# Patient Record
Sex: Female | Born: 1965 | Race: Black or African American | Hispanic: No | Marital: Married | Smoking: Never smoker
Health system: Southern US, Community
[De-identification: ages and names within clinical notes are randomized; demographics above are authoritative.]

## PROBLEM LIST (undated history)

## (undated) DIAGNOSIS — M1990 Unspecified osteoarthritis, unspecified site: Secondary | ICD-10-CM

## (undated) DIAGNOSIS — J302 Other seasonal allergic rhinitis: Secondary | ICD-10-CM

## (undated) DIAGNOSIS — R011 Cardiac murmur, unspecified: Secondary | ICD-10-CM

## (undated) DIAGNOSIS — J45909 Unspecified asthma, uncomplicated: Secondary | ICD-10-CM

## (undated) DIAGNOSIS — M549 Dorsalgia, unspecified: Secondary | ICD-10-CM

## (undated) DIAGNOSIS — G8929 Other chronic pain: Secondary | ICD-10-CM

## (undated) HISTORY — DX: Other seasonal allergic rhinitis: J30.2

## (undated) HISTORY — DX: Cardiac murmur, unspecified: R01.1

## (undated) HISTORY — PX: TOE SURGERY: SHX1073

## (undated) HISTORY — PX: MOUTH SURGERY: SHX715

## (undated) HISTORY — DX: Unspecified osteoarthritis, unspecified site: M19.90

## (undated) HISTORY — DX: Unspecified asthma, uncomplicated: J45.909

## (undated) HISTORY — PX: REFRACTIVE SURGERY: SHX103

## (undated) HISTORY — DX: Other chronic pain: G89.29

## (undated) HISTORY — DX: Dorsalgia, unspecified: M54.9

---

## 2012-04-08 ENCOUNTER — Other Ambulatory Visit (HOSPITAL_COMMUNITY)
Admission: RE | Admit: 2012-04-08 | Discharge: 2012-04-08 | Disposition: A | Discharge status: Home / Self Care | Payer: Managed Care, Other (non HMO) | Source: Ambulatory Visit | Attending: Gynecology | Admitting: Gynecology

## 2012-04-08 ENCOUNTER — Telehealth: Payer: Self-pay | Admitting: *Deleted

## 2012-04-08 ENCOUNTER — Ambulatory Visit (INDEPENDENT_AMBULATORY_CARE_PROVIDER_SITE_OTHER): Payer: Managed Care, Other (non HMO) | Admitting: Gynecology

## 2012-04-08 ENCOUNTER — Encounter: Payer: Self-pay | Admitting: Gynecology

## 2012-04-08 VITALS — BP 124/68 | Ht 62.0 in | Wt 140.0 lb

## 2012-04-08 DIAGNOSIS — Z131 Encounter for screening for diabetes mellitus: Secondary | ICD-10-CM

## 2012-04-08 DIAGNOSIS — Z01419 Encounter for gynecological examination (general) (routine) without abnormal findings: Secondary | ICD-10-CM | POA: Insufficient documentation

## 2012-04-08 DIAGNOSIS — R011 Cardiac murmur, unspecified: Secondary | ICD-10-CM

## 2012-04-08 DIAGNOSIS — Z1151 Encounter for screening for human papillomavirus (HPV): Secondary | ICD-10-CM | POA: Insufficient documentation

## 2012-04-08 DIAGNOSIS — Z13220 Encounter for screening for lipoid disorders: Secondary | ICD-10-CM

## 2012-04-08 LAB — CBC WITH DIFFERENTIAL/PLATELET
HCT: 41.3 % (ref 36.0–46.0)
Hemoglobin: 14 g/dL (ref 12.0–15.0)
Lymphocytes Relative: 29 % (ref 12–46)
Monocytes Absolute: 0.6 10*3/uL (ref 0.1–1.0)
Monocytes Relative: 10 % (ref 3–12)
Neutro Abs: 3.1 10*3/uL (ref 1.7–7.7)
Neutrophils Relative %: 51 % (ref 43–77)
RBC: 4.59 MIL/uL (ref 3.87–5.11)
WBC: 6 10*3/uL (ref 4.0–10.5)

## 2012-04-08 NOTE — Progress Notes (Signed)
Michele Schmidt 1966/04/06 [id number]        46 y.o.  G3P3 new patient for annual exam.  Several issues noted below.  Past medical history,surgical history, medications, allergies, family history and social history were all reviewed and documented in the EPIC chart. ROS:  Was performed and pertinent positives and negatives are included in the history.  Exam: Amy assistant Filed Vitals:   04/08/12 1110  BP: 124/68  Height: 5\' 2"  (1.575 m)  Weight: 140 lb (63.504 kg)   General appearance  Normal Skin grossly normal Head/Neck normal with no cervical or supraclavicular adenopathy thyroid normal Lungs  clear Cardiac RR, without RMG Abdominal  soft, nontender, without masses, organomegaly or hernia Breasts  examined lying and sitting without masses, retractions, discharge or axillary adenopathy. Pelvic  Ext/BUS/vagina  normal   Cervix  normal IUD string noted, Pap/HPV  Uterus  anteverted, normal size, shape and contour, midline and mobile nontender   Adnexa  Without masses or tenderness    Anus and perineum  normal   Rectovaginal  normal sphincter tone without palpated masses or tenderness.    Assessment/Plan:  46 y.o. G3P3 female for annual exam.   1. IUD management. Patient has Mirena IUD that she believes is 44 years old. She left the card at home and is not sure. She is not sexually active and is not having menses with the IUD. I asked her check at home when it is due to be replaced. If she is at 5 years now and she needs to have it replaced at her choice or have it removed and consider alternatives. Again she is not such an active and has not issue at this point. I discussed with her as long as she is amenorrheic and not sexually active if she would want to go past 5 years for menstrual suppression it's possible that it is offbrand and the potential for complications such as infection migration reviewed, recognizing that it is not good contraceptively. My preference would be  regardless to have it replaced now if she at 5 years. 2. Pap smear.  No history of abnormal Pap smears before with last Pap smear in 2011. Pap/HPV done. If normal then planned every 5 year screening. 3. Mammography. Patient's overdue for mammogram and knows to schedule this and agrees to do so.  SBE monthly reviewed. 4. Cardiac murmur. Patient has a slight systolic murmur. Never been told this before. We'll have cardiology auscultate and evaluate as they see fit. 5. Asthma. Patient has mild asthma treated with an inhaler during allergy season and Ishe asked if I could refill her inhaler when do and she'll call as needed. 6. Health maintenance. Baseline CBC lipid profile glucose urinalysis ordered.    Dara Lords MD, 12:02 PM 04/08/2012

## 2012-04-08 NOTE — Telephone Encounter (Signed)
Pt informed with appointment 04/29/12 2:45 pm with Dr.Nishan, referral order placed.

## 2012-04-08 NOTE — Patient Instructions (Signed)
Office will contact you to arrange the cardiology appointment.  Follow up for your Mirena IUD management. Follow up for annual gynecologic exam.

## 2012-04-08 NOTE — Telephone Encounter (Signed)
Message copied by Aura Camps on Thu Apr 08, 2012 12:54 PM ------      Message from: Mckinley Jewel, AMY L      Created: Thu Apr 08, 2012 12:22 PM       Candise Bowens, also send to [facility] please.  Thanks!      ----- Message -----         From: Dara Lords, MD         Sent: 04/08/2012  12:08 PM           To: Amy Duwaine Maxin, CNA            #1 scheduled cardiology appointment with [place] reference subtle systolic murmur never appreciated before      #2 check Mirena benefits for coverage

## 2012-04-09 ENCOUNTER — Other Ambulatory Visit: Payer: Self-pay | Admitting: Gynecology

## 2012-04-09 ENCOUNTER — Telehealth: Payer: Self-pay | Admitting: Gynecology

## 2012-04-09 DIAGNOSIS — Z3049 Encounter for surveillance of other contraceptives: Secondary | ICD-10-CM

## 2012-04-09 LAB — URINALYSIS W MICROSCOPIC + REFLEX CULTURE
Bacteria, UA: NONE SEEN
Bilirubin Urine: NEGATIVE
Crystals: NONE SEEN
Glucose, UA: NEGATIVE mg/dL
Ketones, ur: NEGATIVE mg/dL
Protein, ur: NEGATIVE mg/dL
Specific Gravity, Urine: 1.025 (ref 1.005–1.030)
Urobilinogen, UA: 1 mg/dL (ref 0.0–1.0)

## 2012-04-09 LAB — LIPID PANEL
Cholesterol: 174 mg/dL (ref 0–200)
Total CHOL/HDL Ratio: 3.1 Ratio
Triglycerides: 64 mg/dL (ref <150)
VLDL: 13 mg/dL (ref 0–40)

## 2012-04-09 LAB — GLUCOSE, RANDOM: Glucose, Bld: 85 mg/dL (ref 70–99)

## 2012-04-09 MED ORDER — LEVONORGESTREL 20 MCG/24HR IU IUD: INTRAUTERINE_SYSTEM | Freq: Once | INTRAUTERINE | Status: AC

## 2012-04-09 NOTE — Telephone Encounter (Signed)
Patient informed that Mirena IUD, insertion and removal is covered at 100% with no pre-certification is required.  Patient wants to proceed and will call with menses to schedule appointment with D. TF.

## 2012-04-28 ENCOUNTER — Encounter: Payer: Self-pay | Admitting: *Deleted

## 2012-04-28 ENCOUNTER — Encounter: Payer: Self-pay | Admitting: Cardiovascular Disease

## 2012-04-28 DIAGNOSIS — J45909 Unspecified asthma, uncomplicated: Secondary | ICD-10-CM | POA: Insufficient documentation

## 2012-04-28 DIAGNOSIS — G8929 Other chronic pain: Secondary | ICD-10-CM | POA: Insufficient documentation

## 2012-04-28 DIAGNOSIS — M549 Dorsalgia, unspecified: Secondary | ICD-10-CM

## 2012-04-28 DIAGNOSIS — M1990 Unspecified osteoarthritis, unspecified site: Secondary | ICD-10-CM | POA: Insufficient documentation

## 2012-04-28 DIAGNOSIS — J302 Other seasonal allergic rhinitis: Secondary | ICD-10-CM | POA: Insufficient documentation

## 2012-04-29 ENCOUNTER — Encounter: Payer: Self-pay | Admitting: Cardiovascular Disease

## 2012-04-29 ENCOUNTER — Ambulatory Visit: Payer: Managed Care, Other (non HMO) | Admitting: Cardiovascular Disease

## 2012-04-29 ENCOUNTER — Ambulatory Visit (INDEPENDENT_AMBULATORY_CARE_PROVIDER_SITE_OTHER): Payer: Managed Care, Other (non HMO) | Admitting: Cardiovascular Disease

## 2012-04-29 VITALS — BP 112/72 | HR 76 | Ht 62.0 in | Wt 135.0 lb

## 2012-04-29 DIAGNOSIS — R0989 Other specified symptoms and signs involving the circulatory and respiratory systems: Secondary | ICD-10-CM

## 2012-04-29 DIAGNOSIS — R9431 Abnormal electrocardiogram [ECG] [EKG]: Secondary | ICD-10-CM | POA: Insufficient documentation

## 2012-04-29 DIAGNOSIS — R011 Cardiac murmur, unspecified: Secondary | ICD-10-CM

## 2012-04-29 NOTE — Progress Notes (Signed)
Patient ID: Michele Schmidt, female   DOB: Nov 14, 1965, 46 y.o.   MRN: [id number] 46 yo referred by ob/gyn doctor for murmur.  No previous history of  Active no chest pain dyspnea or palpitations.  Needs IUD changes.  No syncope lung disease LE edema Has not had echo before. No history of congenital heart disease rheumatic fever or family history of heart valve problems.     ROS: Denies fever, malais, weight loss, blurry vision, decreased visual acuity, cough, sputum, SOB, hemoptysis, pleuritic pain, palpitaitons, heartburn, abdominal pain, melena, lower extremity edema, claudication, or rash.  All other systems reviewed and negative   General: Affect appropriate Healthy:  appears stated age HEENT: normal Neck supple with no adenopathy JVP normal no bruits no thyromegaly Lungs clear with no wheezing and good diaphragmatic motion Heart:  S1/S2 loud with SEM radiates to right carotid vs separate  murmur,rub, gallop or click PMI normal Abdomen: benighn, BS positve, no tenderness, no AAA no bruit.  No HSM or HJR Distal pulses intact with no bruits No edema Neuro non-focal Skin warm and dry No muscular weakness  Medications Current Outpatient Prescriptions  Medication Sig Dispense Refill  . ALBUTEROL IN Inhale into the lungs as needed.        Current Facility-Administered Medications  Medication Dose Route Frequency Provider Last Rate Last Dose  . levonorgestrel (MIRENA) 20 MCG/24HR IUD   Intrauterine Once Dara Lords, MD        Allergies Penicillins  Family History: Family History  Problem Relation Age of Onset  . Cancer Mother     uterine cancer - pre menopause  . Hypertension Mother   . Diabetes Mother   . Glaucoma Mother   . Diabetes Father     prediabetic  . Asthma Brother   . Asthma Paternal Grandfather     Social History: History   Social History  . Marital Status: Married    Spouse Name: N/A    Number of Children: N/A  . Years of Education: N/A     Occupational History  . Not on file.   Social History Main Topics  . Smoking status: Never Smoker   . Smokeless tobacco: Never Used  . Alcohol Use: Yes     sparingly  . Drug Use: No  . Sexually Active: No   Other Topics Concern  . Not on file   Social History Narrative  . No narrative on file    Electrocardiogram:  NSR Possible LAE LAD pulmonary disease pattern  Assessment and Plan

## 2012-04-29 NOTE — Assessment & Plan Note (Signed)
May be referred murmur from AV but prominent.  F/U carotid duplex

## 2012-04-29 NOTE — Patient Instructions (Signed)
Your physician wants you to follow-up in:  YEAR WITH DR Haywood Filler will receive a reminder letter in the mail two months in advance. If you don't receive a letter, please call our office to schedule the follow-up appointment. Your physician has requested that you have an echocardiogram. Echocardiography is a painless test that uses sound waves to create images of your heart. It provides your doctor with information about the size and shape of your heart and how well your heart's chambers and valves are working. This procedure takes approximately one hour. There are no restrictions for this procedure. DX MURMUR Your physician has requested that you have a carotid duplex. This test is an ultrasound of the carotid arteries in your neck. It looks at blood flow through these arteries that supply the brain with blood. Allow one hour for this exam. There are no restrictions or special instructions. DX BRUIT

## 2012-04-29 NOTE — Assessment & Plan Note (Signed)
May have bicuspid AV  F/U echo  Ok to proceed with IUD No need for SBE

## 2012-04-29 NOTE — Assessment & Plan Note (Signed)
Echo to assess RV and LV as well as w/u murmur.  Not ischemic.  ICRBBB  No documented lung disease

## 2012-05-06 ENCOUNTER — Ambulatory Visit (HOSPITAL_COMMUNITY): Payer: Managed Care, Other (non HMO) | Attending: Cardiology | Admitting: Radiology

## 2012-05-06 DIAGNOSIS — R011 Cardiac murmur, unspecified: Secondary | ICD-10-CM | POA: Insufficient documentation

## 2012-05-06 DIAGNOSIS — I079 Rheumatic tricuspid valve disease, unspecified: Secondary | ICD-10-CM | POA: Insufficient documentation

## 2012-05-06 DIAGNOSIS — I059 Rheumatic mitral valve disease, unspecified: Secondary | ICD-10-CM | POA: Insufficient documentation

## 2012-05-06 NOTE — Progress Notes (Signed)
Echocardiogram performed.  

## 2012-05-07 ENCOUNTER — Other Ambulatory Visit: Payer: Self-pay | Admitting: *Deleted

## 2012-05-07 ENCOUNTER — Encounter (INDEPENDENT_AMBULATORY_CARE_PROVIDER_SITE_OTHER): Payer: Managed Care, Other (non HMO)

## 2012-05-07 DIAGNOSIS — R0989 Other specified symptoms and signs involving the circulatory and respiratory systems: Secondary | ICD-10-CM

## 2012-05-28 ENCOUNTER — Other Ambulatory Visit: Payer: Self-pay | Admitting: Gynecology

## 2012-05-28 ENCOUNTER — Encounter: Payer: Self-pay | Admitting: Gynecology

## 2012-05-28 ENCOUNTER — Ambulatory Visit (INDEPENDENT_AMBULATORY_CARE_PROVIDER_SITE_OTHER): Payer: Managed Care, Other (non HMO) | Admitting: Gynecology

## 2012-05-28 DIAGNOSIS — Z30433 Encounter for removal and reinsertion of intrauterine contraceptive device: Secondary | ICD-10-CM

## 2012-05-28 DIAGNOSIS — J45909 Unspecified asthma, uncomplicated: Secondary | ICD-10-CM

## 2012-05-28 MED ORDER — ALBUTEROL SULFATE HFA 108 (90 BASE) MCG/ACT IN AERS: 2.0000 | INHALATION_SPRAY | Freq: Four times a day (QID) | RESPIRATORY_TRACT | Status: AC | PRN

## 2012-05-28 NOTE — Progress Notes (Addendum)
Patient presents for Mirena IUD removal and replacement. She is at the five-year interval. She's not currently sexually active. Has no menses.  She has read through the booklet, has no contraindications and signed the consent form.  I reviewed the removal and reinsertional process with her as well as the risks to include infection either immediate or long-term, uterine perforation or migration requiring surgery to remove, other complications such as pain, hormonal side effects and possibilities of failure with subsequent pregnancy.    Exam with Sherrilyn Rist assistant Pelvic: External BUS vagina normal. Cervix normal IUD string visualized. Uterus anteverted normal size shape contour midline mobile nontender. Adnexa without masses or tenderness.  Procedure: The cervix was cleansed with Betadine, the IUD string was grasped with a [place] forceps and her Mirena IUD was removed, shown to patient and discarded. The anterior lip of the cervix was grasped with a single-tooth tenaculum, the uterus was sounded and a Mirena IUD was placed according to manufacturer's recommendations without difficulty. The strings were trimmed. The patient tolerated well and will follow up in one month for a postinsertional check.  Lot number: [id number]  Addendum: Patient asked if I would refill her albuterol inhaler as she does have a history of asthma and uses occasionally. I examined her today and her lungs are clear. I refilled her albuterol #1 with 3 refills.

## 2012-05-28 NOTE — Patient Instructions (Signed)
Intrauterine Device Insertion Most often, an intrauterine device (IUD) is inserted into the uterus to prevent pregnancy. There are 2 types of IUDs available:  Copper IUD. This type of IUD creates an environment that is not favorable to sperm survival. The mechanism of action of the copper IUD is not known for certain. It can stay in place for 10 years.   Hormone IUD. This type of IUD contains the hormone progestin (synthetic progesterone). The progestin thickens the cervical mucus and prevents sperm from entering the uterus, and it also thins the uterine lining. There is no evidence that the hormone IUD prevents implantation. The hormone IUD can stay in place for up to 5 years.  An IUD is the most cost-effective birth control if left in place for the full duration. It may be removed at any time. LET YOUR CAREGIVER KNOW ABOUT:  Sensitivity to metals.   Medicines taken including herbs, eyedrops, over-the-counter medicines, and creams.   Use of steroids (by mouth or creams).   Previous problems with anesthetics or numbing medicine.   Previous gynecological surgery.   History of blood clots or clotting disorders.   Possibility of pregnancy.   Menstrual irregularities.   Concerns regarding unusual vaginal discharge or odors.   Previous experience with an IUD.   Other health problems.  RISKS AND COMPLICATIONS  Accidental puncture (perforation) of the uterus.   Accidental placement of the IUD either in the muscle layer of the uterus (myometrium) or outside the uterus. If this happen, the IUD can be found essentially floating around the bowels. When this happens, the IUD must be taken out surgically.   The IUD may fall out of the uterus (expulsion). This is more common in women who have recently had a child.    Pregnancy in the fallopian tube (ectopic).  BEFORE THE PROCEDURE  Schedule the IUD insertion for when you will have your menstrual period or right after, to make sure you  are not pregnant. Placement of the IUD is better tolerated shortly after a menstrual cycle.   You may need to take tests or be examined to make sure you are not pregnant.   You may be required to take a pregnancy test.   You may be required to get checked for sexually transmitted infections (STIs) prior to placement. Placing an IUD in someone who has an infection can make an infection worse.   You may be given a pain reliever to take 1 or 2 hours before the procedure.   An exam will be performed to determine the size and position of your uterus.   Ask your caregiver about changing or stopping your regular medicines.  PROCEDURE   A tool (speculum) is placed in the vagina. This allows your caregiver to see the lower part of the uterus (cervix).   The cervix is prepped with a medicine that lowers the risk of infection.   You may be given a medicine to numb each side of the cervix (intracervical or paracervical block). This is used to block and control any discomfort with insertion.   A tool (uterine sound) is inserted into the uterus to determine the length of the uterine cavity and the direction the uterus may be tilted.   A slim instrument (IUD inserter) is inserted through the cervical canal and into your uterus.   The IUD is placed in the uterine cavity and the insertion device is removed.   The nylon string that is attached to the IUD, and used for   eventual IUD removal, is trimmed. It is trimmed so that it lays high in the vagina, just outside the cervix.  AFTER THE PROCEDURE  You may have bleeding after the procedure. This is normal. It varies from light spotting for a few days to menstrual-like bleeding.   You may have mild cramping.   Practice checking the string coming out of the cervix to make sure the IUD remains in the uterus. If you cannot feel the string, you should schedule a "string check" with your caregiver.   If you had a hormone IUD inserted, expect that your  period may be lighter or nonexistent within a year's time (though this is not always the case). There may be delayed fertility with the hormone IUD as a result of its progesterone effect. When you are ready to become pregnant, it is suggested to have the IUD removed up to 1 year in advance.   Yearly exams are advised.  Document Released: 04/16/2011 Document Revised: 08/07/2011 Document Reviewed: 04/16/2011 [employer] Patient Information 2012 [employer]. 

## 2012-07-02 ENCOUNTER — Ambulatory Visit (INDEPENDENT_AMBULATORY_CARE_PROVIDER_SITE_OTHER): Payer: Managed Care, Other (non HMO) | Admitting: Gynecology

## 2012-07-02 ENCOUNTER — Encounter: Payer: Self-pay | Admitting: Gynecology

## 2012-07-02 DIAGNOSIS — Z30431 Encounter for routine checking of intrauterine contraceptive device: Secondary | ICD-10-CM

## 2012-07-02 NOTE — Progress Notes (Signed)
Patient presents for IUD follow up. Does note a little bit of acne and breast tenderness since placement.  Exam was kim assistant Pelvic external BUS vagina normal. Cervix normal with IUD string visualized an appropriate length. Uterus normal size midline mobile nontender. Adnexa without masses or tenderness.  Assessment and plan: Normal IUD check. Reassured patient as far as hormonal side effects of acne and breast tenderness. Expect to resolve over the next month or 2. Follow up if continued issues otherwise follow up August 2014 for annual exam. Also again encouraged patient to schedule her mammogram

## 2012-07-02 NOTE — Patient Instructions (Addendum)
Call to Schedule your mammogram  Facilities in [place]: 1)  [facility] [place], [place] Phone: [phone] 2)  [facility], [phone] Phone: [phone] 3)  Dr. Yolanda Bonine at [facility] [address] Phone: [phone]     Mammogram A mammogram is an X-ray test to find changes in a woman's breast. You should get a mammogram if:  You are 39 years of age or older  You have risk factors.   Your doctor recommends that you have one.  BEFORE THE TEST  Do not schedule the test the week before your period, especially if your breasts are sore during this time.  On the day of your mammogram:  Wash your breasts and armpits well. After washing, do not put on any deodorant or talcum powder on until after your test.   Eat and drink as you usually do.   Take your medicines as usual.   If you are diabetic and take insulin, make sure you:   Eat before coming for your test.   Take your insulin as usual.   If you cannot keep your appointment, call before the appointment to cancel. Schedule another appointment.  TEST  You will need to undress from the waist up. You will put on a hospital gown.   Your breast will be put on the mammogram machine, and it will press firmly on your breast with a piece of plastic called a compression paddle. This will make your breast flatter so that the machine can X-ray all parts of your breast.   Both breasts will be X-rayed. Each breast will be X-rayed from above and from the side. An X-ray might need to be taken again if the picture is not good enough.   The mammogram will last about 15 to 30 minutes.  AFTER THE TEST Finding out the results of your test Ask when your test results will be ready. Make sure you get your test results.  Document Released: 11/14/2008 Document Revised: 08/07/2011 Document Reviewed: 11/14/2008 [facility] Patient  Information 2012 [place], [place].      Intrauterine Device Insertion Most often, an intrauterine device (IUD) is inserted into the uterus to prevent pregnancy. There are 2 types of IUDs available:  Copper IUD. This type of IUD creates an environment that is not favorable to sperm survival. The mechanism of action of the copper IUD is not known for certain. It can stay in place for 10 years.  Hormone IUD. This type of IUD contains the hormone progestin (synthetic progesterone). The progestin thickens the cervical mucus and prevents sperm from entering the uterus, and it also thins the uterine lining. There is no evidence that the hormone IUD prevents implantation. The hormone IUD can stay in place for up to 5 years. An IUD is the most cost-effective birth control if left in place for the full duration. It may be removed at any time. LET YOUR CAREGIVER KNOW ABOUT:  Sensitivity to metals.  Medicines taken including herbs, eyedrops, over-the-counter medicines, and creams.  Use of steroids (by mouth or creams).  Previous problems with anesthetics or numbing medicine.  Previous gynecological surgery.  History of blood clots or clotting disorders.  Possibility of pregnancy.  Menstrual irregularities.  Concerns regarding unusual vaginal discharge or odors.  Previous experience with an IUD.  Other health problems. RISKS AND COMPLICATIONS  Accidental puncture (perforation) of the uterus.  Accidental placement of the IUD  either in the muscle layer of the uterus (myometrium) or outside the uterus. If this happen, the IUD can be found essentially floating around the bowels. When this happens, the IUD must be taken out surgically.  The IUD may fall out of the uterus (expulsion). This is more common in women who have recently had a child.   Pregnancy in the fallopian tube (ectopic). BEFORE THE PROCEDURE  Schedule the IUD insertion for when you will have your menstrual period or right  after, to make sure you are not pregnant. Placement of the IUD is better tolerated shortly after a menstrual cycle.  You may need to take tests or be examined to make sure you are not pregnant.  You may be required to take a pregnancy test.  You may be required to get checked for sexually transmitted infections (STIs) prior to placement. Placing an IUD in someone who has an infection can make an infection worse.  You may be given a pain reliever to take 1 or 2 hours before the procedure.  An exam will be performed to determine the size and position of your uterus.  Ask your caregiver about changing or stopping your regular medicines. PROCEDURE   A tool (speculum) is placed in the vagina. This allows your caregiver to see the lower part of the uterus (cervix).  The cervix is prepped with a medicine that lowers the risk of infection.  You may be given a medicine to numb each side of the cervix (intracervical or paracervical block). This is used to block and control any discomfort with insertion.  A tool (uterine sound) is inserted into the uterus to determine the length of the uterine cavity and the direction the uterus may be tilted.  A slim instrument (IUD inserter) is inserted through the cervical canal and into your uterus.  The IUD is placed in the uterine cavity and the insertion device is removed.  The nylon string that is attached to the IUD, and used for eventual IUD removal, is trimmed. It is trimmed so that it lays high in the vagina, just outside the cervix. AFTER THE PROCEDURE  You may have bleeding after the procedure. This is normal. It varies from light spotting for a few days to menstrual-like bleeding.  You may have mild cramping.  Practice checking the string coming out of the cervix to make sure the IUD remains in the uterus. If you cannot feel the string, you should schedule a "string check" with your caregiver.  If you had a hormone IUD inserted, expect that  your period may be lighter or nonexistent within a year's time (though this is not always the case). There may be delayed fertility with the hormone IUD as a result of its progesterone effect. When you are ready to become pregnant, it is suggested to have the IUD removed up to 1 year in advance.  Yearly exams are advised. Document Released: 04/16/2011 Document Revised: 11/10/2011 Document Reviewed: 04/16/2011 [facility] Patient Information 2013 [place], [place].

## 2012-07-15 ENCOUNTER — Other Ambulatory Visit: Payer: Self-pay | Admitting: Gynecology

## 2012-07-15 DIAGNOSIS — Z1231 Encounter for screening mammogram for malignant neoplasm of breast: Secondary | ICD-10-CM

## 2012-08-05 ENCOUNTER — Ambulatory Visit
Admission: RE | Admit: 2012-08-05 | Discharge: 2012-08-05 | Disposition: A | Discharge status: Home / Self Care | Payer: Managed Care, Other (non HMO) | Source: Ambulatory Visit | Attending: Gynecology | Admitting: Gynecology

## 2012-08-05 DIAGNOSIS — Z1231 Encounter for screening mammogram for malignant neoplasm of breast: Secondary | ICD-10-CM

## 2012-08-06 ENCOUNTER — Other Ambulatory Visit: Payer: Self-pay | Admitting: *Deleted

## 2012-08-06 DIAGNOSIS — N644 Mastodynia: Secondary | ICD-10-CM

## 2012-08-20 ENCOUNTER — Other Ambulatory Visit: Payer: Managed Care, Other (non HMO)

## 2012-09-09 ENCOUNTER — Ambulatory Visit
Admission: RE | Admit: 2012-09-09 | Discharge: 2012-09-09 | Disposition: A | Discharge status: Home / Self Care | Payer: Managed Care, Other (non HMO) | Source: Ambulatory Visit | Attending: Gynecology | Admitting: Gynecology

## 2012-09-09 DIAGNOSIS — N644 Mastodynia: Secondary | ICD-10-CM

## 2013-06-07 ENCOUNTER — Ambulatory Visit (INDEPENDENT_AMBULATORY_CARE_PROVIDER_SITE_OTHER): Payer: Managed Care, Other (non HMO) | Admitting: Gynecology

## 2013-06-07 ENCOUNTER — Encounter: Payer: Self-pay | Admitting: Gynecology

## 2013-06-07 VITALS — BP 106/60 | Ht 62.0 in | Wt 141.0 lb

## 2013-06-07 DIAGNOSIS — Z01419 Encounter for gynecological examination (general) (routine) without abnormal findings: Secondary | ICD-10-CM

## 2013-06-07 DIAGNOSIS — Z13220 Encounter for screening for lipoid disorders: Secondary | ICD-10-CM

## 2013-06-07 DIAGNOSIS — Z30431 Encounter for routine checking of intrauterine contraceptive device: Secondary | ICD-10-CM

## 2013-06-07 DIAGNOSIS — R6889 Other general symptoms and signs: Secondary | ICD-10-CM

## 2013-06-07 DIAGNOSIS — N644 Mastodynia: Secondary | ICD-10-CM

## 2013-06-07 LAB — COMPREHENSIVE METABOLIC PANEL
AST: 12 U/L (ref 0–37)
Albumin: 4.2 g/dL (ref 3.5–5.2)
Alkaline Phosphatase: 53 U/L (ref 39–117)
BUN: 7 mg/dL (ref 6–23)
Potassium: 4.1 mEq/L (ref 3.5–5.3)

## 2013-06-07 LAB — LIPID PANEL
HDL: 47 mg/dL (ref >39)
LDL Cholesterol: 78 mg/dL (ref 0–99)
Total CHOL/HDL Ratio: 3.2 Ratio
VLDL: 26 mg/dL (ref 0–40)

## 2013-06-07 LAB — CBC WITH DIFFERENTIAL/PLATELET
Basophils Absolute: 0.1 10*3/uL (ref 0.0–0.1)
Basophils Relative: 1 % (ref 0–1)
HCT: 40.6 % (ref 36.0–46.0)
MCHC: 33.7 g/dL (ref 30.0–36.0)
Monocytes Absolute: 0.6 10*3/uL (ref 0.1–1.0)
Neutro Abs: 3.6 10*3/uL (ref 1.7–7.7)
Neutrophils Relative %: 50 % (ref 43–77)
RDW: 13 % (ref 11.5–15.5)

## 2013-06-07 NOTE — Patient Instructions (Signed)
Use heat to the tender area on her chest wall. Also take nonsteroidal anti-inflammatories such as Motrin. Continue with self breast exams. If you have any palpable abnormalities then represent for further evaluation. Followup mammogram in January when due.

## 2013-06-07 NOTE — Progress Notes (Signed)
Michele Schmidt 08/27/1966 [id number]        47 y.o.  G3P3 for annual exam.  Doing well.  Past medical history,surgical history, medications, allergies, family history and social history were all reviewed and documented in the EPIC chart.  ROS:  Performed and pertinent positives and negatives are included in the history, assessment and plan .  Exam: Kim assistant Filed Vitals:   06/07/13 1442  BP: 106/60  Height: 5\' 2"  (1.575 m)  Weight: 141 lb (63.957 kg)   General appearance  Normal Skin grossly normal Head/Neck normal with no cervical or supraclavicular adenopathy thyroid normal Lungs  clear Cardiac RR, without RMG Abdominal  soft, nontender, without masses, organomegaly or hernia Breasts  examined lying and sitting without masses, retractions, discharge or axillary adenopathy. Tender over her left lower sternal/rib juncture Pelvic  Ext/BUS/vagina  normal  Cervix  normal with IUD string at the external os  Uterus  anteverted, normal size, shape and contour, midline and mobile nontender   Adnexa  Without masses or tenderness    Anus and perineum  normal   Rectovaginal  normal sphincter tone without palpated masses or tenderness.    Assessment/Plan:  47 y.o. G3P3 female for annual exam, amenorrhea, Mirena IUD.   1. Mirena IUD 05/2012. Patient doing well, amenorrhea. We'll continue to monitor. 2. Left chest wall tenderness consistent with costochondritis. Has been going on for close to a year. Had diagnostic mammography and ultrasound of this area January 2014 which were normal. No palpable or visual abnormalities other than tenderness at the sternal/rib joint. Recommend heat and nonsteroidal anti-inflammatories. Do not think anything further really is indicated at this time. She'll continue with self breast exams as long she feels no abnormalities and will follow. She'll continue with annual mammography. SBE monthly reviewed. 3. Pap smear/HPV 2013 normal. No Pap smear done  today. No history of abnormal Pap smears previously. Plan repeat at 3-5 year interval. 4. Health maintenance. Baseline CBC comprehensive metabolic panel lipid profile urinalysis ordered. She does note some cold intolerance and a check TSH also. Followup in one year, sooner as needed.  Note: This document was prepared with digital dictation and possible smart phrase technology. Any transcriptional errors that result from this process are unintentional.   Dara Lords MD, 3:12 PM 06/07/2013

## 2013-06-08 LAB — URINALYSIS W MICROSCOPIC + REFLEX CULTURE
Hgb urine dipstick: NEGATIVE
Leukocytes, UA: NEGATIVE
Nitrite: NEGATIVE
Protein, ur: NEGATIVE mg/dL
Urobilinogen, UA: 0.2 mg/dL (ref 0.0–1.0)
pH: 7 (ref 5.0–8.0)

## 2013-06-08 LAB — TSH: TSH: 1.128 u[IU]/mL (ref 0.350–4.500)

## 2013-07-07 ENCOUNTER — Other Ambulatory Visit: Payer: Self-pay

## 2013-07-27 ENCOUNTER — Encounter: Payer: Self-pay | Admitting: Podiatrist

## 2013-07-27 DIAGNOSIS — M2020 Hallux rigidus, unspecified foot: Secondary | ICD-10-CM

## 2013-08-04 ENCOUNTER — Encounter: Payer: Self-pay | Admitting: Podiatrist

## 2013-08-04 ENCOUNTER — Ambulatory Visit (INDEPENDENT_AMBULATORY_CARE_PROVIDER_SITE_OTHER): Payer: Managed Care, Other (non HMO) | Admitting: Podiatrist

## 2013-08-04 ENCOUNTER — Ambulatory Visit (INDEPENDENT_AMBULATORY_CARE_PROVIDER_SITE_OTHER): Payer: Managed Care, Other (non HMO)

## 2013-08-04 VITALS — BP 121/81 | HR 97 | Temp 98.5°F | Resp 16 | Ht 62.0 in | Wt 135.0 lb

## 2013-08-04 DIAGNOSIS — Z9889 Other specified postprocedural states: Secondary | ICD-10-CM

## 2013-08-04 NOTE — Patient Instructions (Addendum)
Start doing your exercises now-- they will keep the toe from becoming scarred down and will get the joint moving!  Stay in your boot for 1 more week-- we will remove the sutures and get you a smaller surgical shoe next week  You may get your foot wet-  Be careful not to put weight on your foot when bathing or showering.  If you want to use the shower, use a seated shower chair.    Do no submerge your foot.

## 2013-08-04 NOTE — Progress Notes (Signed)
  Subjective: Michele Schmidt presents today for her first postop followup visit status post shortening plantar flexor he osteotomy of the first metatarsal left. She states she's doing well and she has been wearing the boot as instructed. She denies any nausea, no vomiting, no fevers, no chills, no calf pain or tenderness. Overall she's been doing well. Objective: Excellent appearance of the operative foot is noted. Incision site is well coapted with suture line in Steri-Strips in place. Minimal swelling is seen no redness, no drainage, no malodor, minimal ecchymosis is present. Clinical appearance is excellent. Range of motion is improved from the preoperative range of motion but not where it was immediately postoperatively in the operating room. X-rays reveal well aligned first metatarsophalangeal joint with increased joint space noted. Assessment: One week status post shortening first metatarsal osteotomy Plan: Redressed the foot and a dry sterile compressive dressing. Discussed the x-ray findings. Encouraged range of motion and instructed her that if the range of motion does not improve she will have to do early physical therapy on this toe. We also discussed surgery on the right great toe and she's having the same problems as the left. The consent forms were signed and the patient's procedure was explained to the best of my ability. Risks benefits and alternatives were also explained. The patient's questions were encouraged and answered. She will be seen on December 17 for her right foot. Call if any problems or concerns arise prior that visit.  Marlowe Aschoff DPM

## 2013-08-11 ENCOUNTER — Ambulatory Visit (INDEPENDENT_AMBULATORY_CARE_PROVIDER_SITE_OTHER): Payer: Managed Care, Other (non HMO) | Admitting: Podiatrist

## 2013-08-11 VITALS — BP 114/74 | HR 90 | Resp 16 | Ht 62.0 in | Wt 135.0 lb

## 2013-08-11 DIAGNOSIS — Z9889 Other specified postprocedural states: Secondary | ICD-10-CM

## 2013-08-11 MED ORDER — SULFAMETHOXAZOLE-TMP DS 800-160 MG PO TABS: 1.0000 | ORAL_TABLET | Freq: Two times a day (BID) | ORAL | Status: DC

## 2013-08-11 NOTE — Patient Instructions (Signed)
Pre-Operative Instructions  Congratulations, you have decided to take an important step to improving your quality of life.  You can be assured that the doctors of [facility] will be with you every step of the way.  1. Plan to be at the surgery center/hospital at least 1 (one) hour prior to your scheduled time unless otherwise directed by the surgical center/hospital staff.  You must have a responsible adult accompany you, remain during the surgery and drive you home.  Make sure you have directions to the surgical center/hospital and know how to get there on time. 2. For hospital based surgery you will need to obtain a history and physical form from your family physician within 1 month prior to the date of surgery- we will give you a form for you primary physician.  3. We make every effort to accommodate the date you request for surgery.  There are however, times where surgery dates or times have to be moved.  We will contact you as soon as possible if a change in schedule is required.   4. No Aspirin/Ibuprofen for one week before surgery.  If you are on aspirin, any non-steroidal anti-inflammatory medications (Mobic, Aleve, Ibuprofen) you should stop taking it 7 days prior to your surgery.  You make take Tylenol  For pain prior to surgery.  5. Medications- If you are taking daily heart and blood pressure medications, seizure, reflux, allergy, asthma, anxiety, pain or diabetes medications, make sure the surgery center/hospital is aware before the day of surgery so they may notify you which medications to take or avoid the day of surgery. 6. No food or drink after midnight the night before surgery unless directed otherwise by surgical center/hospital staff. 7. No alcoholic beverages 24 hours prior to surgery.  No smoking 24 hours prior to or 24 hours after surgery. 8. Wear loose pants or shorts- loose enough to fit over bandages, boots, and casts. 9. No slip on shoes, sneakers are best. 10. Bring  your boot with you to the surgery center/hospital.  Also bring crutches or a walker if your physician has prescribed it for you.  If you do not have this equipment, it will be provided for you after surgery. 11. If you have not been contracted by the surgery center/hospital by the day before your surgery, call to confirm the date and time of your surgery. 12. Leave-time from work may vary depending on the type of surgery you have.  Appropriate arrangements should be made prior to surgery with your employer. 13. Prescriptions will be provided immediately following surgery by your doctor.  Have these filled as soon as possible after surgery and take the medication as directed. 14. Remove nail polish on the operative foot. 15. Wash the night before surgery.  The night before surgery wash the foot and leg well with the antibacterial soap provided and water paying special attention to beneath the toenails and in between the toes.  Rinse thoroughly with water and dry well with a towel.  Perform this wash unless told not to do so by your physician.  Enclosed: 1 Ice pack (please put in freezer the night before surgery)   1 Hibiclens skin cleaner   Pre-op Instructions  If you have any questions regarding the instructions, do not hesitate to call our office.  [place]: [address] [place], [place] [address] [phone]  [place]: [address] [place], [place] [address] [phone]  [place]: [address]  [place],  [address] [phone]  Dr. Richard   Tuchman DPM, Dr. Norman Regal DPM Dr. Richard Sikora DPM, Dr. M. Todd Hyatt DPM, Dr. Aralyn Nowak DPM 

## 2013-08-11 NOTE — Progress Notes (Signed)
  Subjective: Daine presents today for her 2nd postop followup visit status post shortening plantar flexor he osteotomy of the first metatarsal left. She states she is experiencing some pain and itching and she has been wearing the boot as instructed. She denies any nausea, no vomiting, no fevers, no chills, no calf pain or tenderness. We did discuss if she is ready to have the right foot taking care of and she states that she does want to go ahead with surgery on the right foot he the left one is still uncomfortable. She does have some burning with urination and she thinks she may have a urinary tract infection   Objective: Excellent appearance of the operative foot is noted. Incision site is well coapted with suture line in Steri-Strips in place. Minimal swelling is seen no redness, no drainage, no malodor, minimal ecchymosis is present. Clinical appearance is excellent. Range of motion is improved from the preoperative range of motion but not where it was immediately postoperatively in the operating room. It does not appear that the patient has been performing her at home range of motion exercises.  Assessment: 2 week status post shortening first metatarsal osteotomy of the left foot  Plan: Suture ends are removed and I Redressed the foot in a dry sterile compressive dressing. Encouraged range of motion and instructed her that if the range of motion does not improve she will have to do early physical therapy on this toe. Darco shoe was also dispensed for the left foot. We also discussed surgery on the right great toe and she's having the same problems as the left. He proposed the same procedure on the right as was done on the left and the patient still wishes to proceed. Risks benefits and alternatives were also explained.  She will be seen on December 17 for her right foot. Her consent form was supposed to have been signed at her last visit however we're unable to located at this time. I will have her  re\re signed her consent form the date of surgery.   Marlowe Aschoff DPM

## 2013-08-12 ENCOUNTER — Telehealth: Payer: Self-pay | Admitting: *Deleted

## 2013-08-12 NOTE — Telephone Encounter (Signed)
Pt requested a handicap sticker.  I told her, she could pick up a 6 month sticker from the back receptionist.

## 2013-08-17 ENCOUNTER — Encounter: Payer: Self-pay | Admitting: Podiatrist

## 2013-08-17 DIAGNOSIS — M2020 Hallux rigidus, unspecified foot: Secondary | ICD-10-CM

## 2013-08-24 ENCOUNTER — Encounter: Payer: Self-pay | Admitting: Podiatrist

## 2013-08-24 ENCOUNTER — Ambulatory Visit (INDEPENDENT_AMBULATORY_CARE_PROVIDER_SITE_OTHER): Payer: Managed Care, Other (non HMO) | Admitting: Podiatrist

## 2013-08-24 ENCOUNTER — Ambulatory Visit (INDEPENDENT_AMBULATORY_CARE_PROVIDER_SITE_OTHER): Payer: Managed Care, Other (non HMO)

## 2013-08-24 ENCOUNTER — Encounter: Payer: Managed Care, Other (non HMO) | Admitting: Podiatry

## 2013-08-24 VITALS — BP 111/61 | HR 69 | Resp 15 | Ht 62.0 in | Wt 135.0 lb

## 2013-08-24 DIAGNOSIS — Z9889 Other specified postprocedural states: Secondary | ICD-10-CM

## 2013-08-24 DIAGNOSIS — Z87898 Personal history of other specified conditions: Secondary | ICD-10-CM

## 2013-08-24 DIAGNOSIS — Z789 Other specified health status: Secondary | ICD-10-CM

## 2013-08-24 MED ORDER — OXYCODONE-ACETAMINOPHEN 5-325 MG PO TABS: 1.0000 | ORAL_TABLET | Freq: Four times a day (QID) | ORAL | Status: DC | PRN

## 2013-08-24 NOTE — Progress Notes (Signed)
Subjective: Patient presents today for her one week postop followup regarding shortening metatarsal osteotomy of the first right foot date of surgery 08/17/2013. She also had the left foot operated on 07/27/2013. She states the right was hurting more than the left whenever did and she's having more pain on the right than the left. She denies any nausea, vomiting, chills, night sweats. She denies any calf pain or tenderness.  Objective: Excellent appearance of the foot is seen swelling about the first metatarsophalangeal joint is noted which is normal at the first postop followup. Incision line is intact no dehiscence, no redness, no swelling, no signs of infection present. X-rays reveal good alignment and position of the first metatarsal phalangeal joint with good space at the first metatarsal phalangeal joint itself.  Assessment: One week status post right foot surgery, 3 weeks status post left foot surgery  Plan: Redressed the right foot and a dry sterile compressive dressing. Discussed that she can get the foot wet in a week and she can also start wearing a Darco shoe in a week. Dispensed an anklet for her use as well in one week's time. I will see her back in 3 weeks for recheck of both feet and rex-ray both feet. I also discussed the importance of range of motion exercises and that she really needs to do these consistently in order to have any improvement in these feet. Her another prescription for Percocet to get her through the holidays just in case she needs it.

## 2013-08-31 ENCOUNTER — Encounter: Payer: Managed Care, Other (non HMO) | Admitting: Podiatrist

## 2013-09-05 NOTE — Progress Notes (Signed)
1) Keller bunion implant with removal of bone spur 1st met left foot

## 2013-09-12 NOTE — Progress Notes (Signed)
1) [place] bunionectomy with screw fixation right foot

## 2013-09-16 ENCOUNTER — Telehealth: Payer: Self-pay | Admitting: *Deleted

## 2013-09-16 ENCOUNTER — Ambulatory Visit (INDEPENDENT_AMBULATORY_CARE_PROVIDER_SITE_OTHER): Payer: Managed Care, Other (non HMO) | Admitting: Podiatrist

## 2013-09-16 ENCOUNTER — Ambulatory Visit (INDEPENDENT_AMBULATORY_CARE_PROVIDER_SITE_OTHER): Payer: Managed Care, Other (non HMO)

## 2013-09-16 VITALS — BP 111/60 | HR 71 | Resp 18

## 2013-09-16 DIAGNOSIS — Z09 Encounter for follow-up examination after completed treatment for conditions other than malignant neoplasm: Secondary | ICD-10-CM

## 2013-09-16 NOTE — Progress Notes (Signed)
  Subjective: Patient presents today for her one month postop followup regarding shortening metatarsal osteotomy of the first right foot date of surgery 08/17/2013. She also had the left foot operated on 07/27/2013. She states "I am doing good and still some pain and right foot still swells and I did use the ice on it"  She states the left great toe joint has now become more uncomfortable than the right foot.  She relates mild pain with ambulation on both. She denies any nausea, vomiting, chills, night sweats. She denies any calf pain or tenderness.   Objective: Excellent appearance of bilateral feet is seen swelling about the first metatarsophalangeal joint is decreased significantly. Incision line is intact and healing nicely bilaterally.  X-rays reveal good alignment and position of the first metatarsal phalangeal joint with good space at the first metatarsal phalangeal joint itself- bilaterally.  The left osteotomy site is fully healed and the right is still healing well.  Discomfort and stiffness at the first metatarsal phalangeal joint is noted to be increased from the previous visit.  Range of motion of the right is acceptable.   Assessment: status post shortening first metatarsal osteotomy/cheilectomy bilateral.  Left foot DOS 07/27/2013; right foot DOS 08/17/2013  Plan: Instructed patient to continue her range of motion exercises and that she really needs to do these consistently in order to have any improvement in these feet. I recommended physical therapy and she will check her insurance to see if this is a covered benefit for her.  She will be seen back in 1 month for follow up.

## 2013-09-16 NOTE — Telephone Encounter (Signed)
Pt states that her insurance covers Physical Therapy.

## 2013-09-20 ENCOUNTER — Other Ambulatory Visit: Payer: Self-pay | Admitting: *Deleted

## 2013-09-20 DIAGNOSIS — R52 Pain, unspecified: Secondary | ICD-10-CM

## 2013-10-21 ENCOUNTER — Ambulatory Visit (INDEPENDENT_AMBULATORY_CARE_PROVIDER_SITE_OTHER): Payer: Managed Care, Other (non HMO)

## 2013-10-21 ENCOUNTER — Ambulatory Visit (INDEPENDENT_AMBULATORY_CARE_PROVIDER_SITE_OTHER): Payer: Managed Care, Other (non HMO) | Admitting: Podiatrist

## 2013-10-21 DIAGNOSIS — Z9889 Other specified postprocedural states: Secondary | ICD-10-CM

## 2013-10-21 NOTE — Progress Notes (Signed)
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  Dates of surgery right foot 08/17/2013. left foot date of surgery on 07/27/2013. She states "both feet are still hurting but they are doing OK" She states the left great toe joint has now become more uncomfortable than the right foot. She relates mild pain with ambulation on both. He has been participating in physical therapy and went to 5 sessions which greatly improved the range of motion at the left first metatarsophalangeal joint.   Objective: Excellent appearance of bilateral feet is seen swelling about the first metatarsophalangeal joint is decreased significantly. Range of motion at the first metatarsophalangeal joint continues to be improved left at 30 dorsiflexion and 20 plantar flexion. Right is 40 dorsiflexion 20 plantar flexion however the right foot was less symptomatic than the left as prior to surgery.  X-rays reveal good alignment and position of the first metatarsal phalangeal joint with good space at the first metatarsal phalangeal joint itself- bilaterally. The left osteotomy site is fully healed and the right is still healing well. Discomfort and stiffness at the first metatarsal phalangeal joint is noted to be increased from the previous visit.   Assessment: status post shortening first metatarsal osteotomy/cheilectomy bilateral. Left foot DOS 07/27/2013; right foot DOS 08/17/2013   Plan: Instructed patient to continue her range of motion exercises and that ideally I will like to get her in some more physical therapy. We are going to wait another month for the right foot to completely heal prior to initiating physical therapy again. The patient states it's out of pocket for her and not cost effective. She will be seen back in 1 month for  follow up. I also extended her time out of work to work at home for 2 more months. We'll reevaluate this in the next visit as well.

## 2013-11-18 ENCOUNTER — Encounter: Payer: Self-pay | Admitting: Podiatrist

## 2013-11-18 ENCOUNTER — Ambulatory Visit (INDEPENDENT_AMBULATORY_CARE_PROVIDER_SITE_OTHER): Payer: Managed Care, Other (non HMO)

## 2013-11-18 ENCOUNTER — Ambulatory Visit (INDEPENDENT_AMBULATORY_CARE_PROVIDER_SITE_OTHER): Payer: Managed Care, Other (non HMO) | Admitting: Podiatrist

## 2013-11-18 VITALS — BP 110/72 | HR 78 | Resp 18

## 2013-11-18 DIAGNOSIS — Z09 Encounter for follow-up examination after completed treatment for conditions other than malignant neoplasm: Secondary | ICD-10-CM

## 2013-11-18 NOTE — Progress Notes (Signed)
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 Dates of surgery right foot 08/17/2013. left foot date of surgery on 07/27/2013. She states that now her left foot feels good and her right foot continues to ache.  By the end of the day the right foot hurts.   Objective: Excellent appearance of bilateral feet is seen swelling about the first metatarsophalangeal joint is decreased significantly.  Right is 40 dorsiflexion 20 plantar flexion however the right foot was less symptomatic than the left as prior to surgery.  The left foot today is not bothersome but she still has pain in the right.  X-rays reveal good alignment and position of the first metatarsal phalangeal joint with good space at the first metatarsal phalangeal joint itself-right with an osteotomy site that is not completely healed on the right foot.    Assessment: status post shortening first metatarsal osteotomy/cheilectomy bilateral. Left foot DOS 07/27/2013; right foot DOS 08/17/2013   Plan: Instructed patient to continue her range of motion exercises  And to obtain walking or running style shoes as today she presents in flat converse type sneakers.  She will be seen back in 1 month for follow up.

## 2013-12-16 ENCOUNTER — Encounter: Payer: Managed Care, Other (non HMO) | Admitting: Podiatrist

## 2013-12-16 NOTE — Progress Notes (Signed)
Patient had a long wait time today as she was "skipped" in the schedule.  She decided to leave and reschedule for a future date.    This encounter was created in error - please disregard.

## 2013-12-23 ENCOUNTER — Ambulatory Visit (INDEPENDENT_AMBULATORY_CARE_PROVIDER_SITE_OTHER): Payer: Managed Care, Other (non HMO)

## 2013-12-23 ENCOUNTER — Encounter: Payer: Self-pay | Admitting: Podiatrist

## 2013-12-23 ENCOUNTER — Ambulatory Visit (INDEPENDENT_AMBULATORY_CARE_PROVIDER_SITE_OTHER): Payer: Self-pay | Admitting: Podiatrist

## 2013-12-23 VITALS — BP 110/72 | HR 78 | Resp 16

## 2013-12-23 DIAGNOSIS — Z9889 Other specified postprocedural states: Secondary | ICD-10-CM

## 2013-12-23 DIAGNOSIS — Z09 Encounter for follow-up examination after completed treatment for conditions other than malignant neoplasm: Secondary | ICD-10-CM

## 2013-12-23 NOTE — Patient Instructions (Signed)
You have been prescribed a topical pain medications through a specialty compounding pharmacy called [facility] out of [place], [place]. A representative from the pharmacy will call to ensure you would like to receive the medication. If you do not hear from them within two business days please call to confirm they have received the prescription. Their telephone number is [phone].    If you have any other questions or concerns regarding the medication please call our office.  

## 2013-12-23 NOTE — Progress Notes (Signed)
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 Dates of surgery right foot 08/17/2013. left foot date of surgery on 07/27/2013. She states that now her feet ache.  She states the pain is better than it was before the surgery but it hasn't completely resolved yet.  She has noticed some improvement.  She is no longer participating in PT due to the out of pocket cost for her.  She is doing at home exercises.  Objective: Excellent appearance of bilateral feet is seen swelling about the first metatarsophalangeal joint is decreased significantly. Right is 40 dorsiflexion 20 plantar flexion and left is 30 degrees Dorsiflexion and 10 degrees plantar flexion.  the range of motion is improved however the patient continues to complain of pain subjectively.  She has never really resumed her normal working activities as she now has been approved to work from home.  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    X-rays reveal good alignment and position of the first mpj's bilateral with healed osteotomies present.   Assessment: status post shortening first metatarsal osteotomy/cheilectomy bilateral. Left foot DOS 07/27/2013; right foot DOS 08/17/2013   Plan: Instructed patient to continue her range of motion exercises And to continue wearing her running style shoes.  I am ordering her some Aspirar cream for her foot.  Her pain should continue to subside.  She will be seen back prn.

## 2013-12-27 ENCOUNTER — Telehealth: Payer: Self-pay | Admitting: *Deleted

## 2013-12-27 NOTE — Telephone Encounter (Signed)
Prescription sent for compound, anti-inflammatory cream Musculoskeletal Pain  Baclofen 2%, Cyclobenzaprine 2%, Diclofenac 3%, and Lidocaine 2% Qty: 240 gm  Diagnosis S/P foot surgery pain, 3 refills   [facility] [address] [place], [place] [id number] [phone]

## 2014-04-18 ENCOUNTER — Other Ambulatory Visit: Payer: Self-pay | Admitting: Gynecology

## 2014-04-19 ENCOUNTER — Telehealth: Payer: Self-pay | Admitting: *Deleted

## 2014-04-19 ENCOUNTER — Ambulatory Visit (INDEPENDENT_AMBULATORY_CARE_PROVIDER_SITE_OTHER): Payer: Managed Care, Other (non HMO) | Admitting: Gynecology

## 2014-04-19 ENCOUNTER — Encounter: Payer: Self-pay | Admitting: Gynecology

## 2014-04-19 DIAGNOSIS — N63 Unspecified lump in unspecified breast: Secondary | ICD-10-CM

## 2014-04-19 DIAGNOSIS — N6019 Diffuse cystic mastopathy of unspecified breast: Secondary | ICD-10-CM

## 2014-04-19 DIAGNOSIS — N6012 Diffuse cystic mastopathy of left breast: Secondary | ICD-10-CM

## 2014-04-19 NOTE — Telephone Encounter (Signed)
Message copied by Aura CampsWEBB, JENNIFER L on Wed Apr 19, 2014 11:32 AM ------      Message from: Dara LordsFONTAINE, TIMOTHY P      Created: Wed Apr 19, 2014 11:12 AM       Schedule diagnostic mammography and ultrasound at breast center reference nodularity left breast 1:00 position 1 fingerbreadth above areola ------

## 2014-04-19 NOTE — Patient Instructions (Signed)
Office will call you to arrange mammogram and ultrasound. 

## 2014-04-19 NOTE — Telephone Encounter (Signed)
ORDERS PLACED AT BREAST CENTER, THEY WILL CONTACT PT TO SCHEDULE.

## 2014-04-19 NOTE — Progress Notes (Signed)
Michele SimmondsRhonda Schmidt 02-07-1966 [id number]        48 y.o.  Michele BenderG3P3 presents with two-week history of nodularity felt in the left breast. Not significantly tender but noticed on self breast exam. Last mammogram over one year ago.  Past medical history,surgical history, problem list, medications, allergies, family history and social history were all reviewed and documented in the EPIC chart.  Directed ROS with pertinent positives and negatives documented in the history of present illness/assessment and plan.  Exam: Michele Schmidt assistant General appearance:  Normal Both breasts examined lying and sitting without masses retractions discharge adenopathy. Increased fibroglandular tissue noted at the 1:00 position left breast 1 fingerbreadth off the areola to the area patient is pointing to. No definitive masses.  Assessment/Plan:  48 y.o. G3P3 with left breast nodularity which feels to be fibroglandular. No definitive masses. Will schedule diagnostic mammography and ultrasound. Patient knows the importance of followup. If negative patient will follow this area as long as it remains unchanged then we'll monitor. If changes then patient knows to followup for further evaluation and Gen. surgical referral.   Note: This document was prepared with digital dictation and possible smart phrase technology. Any transcriptional errors that result from this process are unintentional.   Michele Schmidt,Michele Schmidt P MD, 11:14 AM 04/19/2014

## 2014-04-20 NOTE — Telephone Encounter (Signed)
APPOINTMENT 04/27/14 @ 10:30 AM

## 2014-04-27 ENCOUNTER — Ambulatory Visit
Admission: RE | Admit: 2014-04-27 | Discharge: 2014-04-27 | Disposition: A | Discharge status: Home / Self Care | Payer: Managed Care, Other (non HMO) | Source: Ambulatory Visit | Attending: Gynecology | Admitting: Gynecology

## 2014-04-27 DIAGNOSIS — N63 Unspecified lump in unspecified breast: Secondary | ICD-10-CM

## 2014-06-08 ENCOUNTER — Encounter: Payer: Self-pay | Admitting: Gynecology

## 2014-06-08 ENCOUNTER — Ambulatory Visit (INDEPENDENT_AMBULATORY_CARE_PROVIDER_SITE_OTHER): Payer: Managed Care, Other (non HMO) | Admitting: Gynecology

## 2014-06-08 VITALS — BP 114/70 | Ht 62.0 in | Wt 147.0 lb

## 2014-06-08 DIAGNOSIS — Z30431 Encounter for routine checking of intrauterine contraceptive device: Secondary | ICD-10-CM

## 2014-06-08 DIAGNOSIS — Z01419 Encounter for gynecological examination (general) (routine) without abnormal findings: Secondary | ICD-10-CM

## 2014-06-08 LAB — URINALYSIS W MICROSCOPIC + REFLEX CULTURE
BILIRUBIN URINE: NEGATIVE
Casts: NONE SEEN
Crystals: NONE SEEN
Glucose, UA: NEGATIVE mg/dL
KETONES UR: NEGATIVE mg/dL
Leukocytes, UA: NEGATIVE
Nitrite: NEGATIVE
PROTEIN: NEGATIVE mg/dL
Specific Gravity, Urine: 1.02 (ref 1.005–1.030)
UROBILINOGEN UA: 0.2 mg/dL (ref 0.0–1.0)
pH: 5.5 (ref 5.0–8.0)

## 2014-06-08 LAB — COMPREHENSIVE METABOLIC PANEL
ALK PHOS: 62 U/L (ref 39–117)
ALT: 8 U/L (ref 0–35)
AST: 12 U/L (ref 0–37)
Albumin: 3.9 g/dL (ref 3.5–5.2)
BILIRUBIN TOTAL: 0.5 mg/dL (ref 0.2–1.2)
BUN: 12 mg/dL (ref 6–23)
CO2: 27 meq/L (ref 19–32)
CREATININE: 0.72 mg/dL (ref 0.50–1.10)
Calcium: 9.2 mg/dL (ref 8.4–10.5)
Chloride: 106 mEq/L (ref 96–112)
Glucose, Bld: 70 mg/dL (ref 70–99)
Potassium: 4.6 mEq/L (ref 3.5–5.3)
Sodium: 139 mEq/L (ref 135–145)
Total Protein: 6.9 g/dL (ref 6.0–8.3)

## 2014-06-08 LAB — CBC WITH DIFFERENTIAL/PLATELET
BASOS PCT: 0 % (ref 0–1)
Basophils Absolute: 0 10*3/uL (ref 0.0–0.1)
EOS ABS: 0.6 10*3/uL (ref 0.0–0.7)
EOS PCT: 9 % — AB (ref 0–5)
HCT: 40.5 % (ref 36.0–46.0)
Hemoglobin: 13.7 g/dL (ref 12.0–15.0)
LYMPHS ABS: 1.9 10*3/uL (ref 0.7–4.0)
Lymphocytes Relative: 28 % (ref 12–46)
MCH: 30.5 pg (ref 26.0–34.0)
MCHC: 33.8 g/dL (ref 30.0–36.0)
MCV: 90.2 fL (ref 78.0–100.0)
Monocytes Absolute: 0.6 10*3/uL (ref 0.1–1.0)
Monocytes Relative: 9 % (ref 3–12)
Neutro Abs: 3.6 10*3/uL (ref 1.7–7.7)
Neutrophils Relative %: 54 % (ref 43–77)
PLATELETS: 315 10*3/uL (ref 150–400)
RBC: 4.49 MIL/uL (ref 3.87–5.11)
RDW: 12.4 % (ref 11.5–15.5)
WBC: 6.7 10*3/uL (ref 4.0–10.5)

## 2014-06-08 LAB — LIPID PANEL
CHOL/HDL RATIO: 3.1 ratio
CHOLESTEROL: 168 mg/dL (ref 0–200)
HDL: 54 mg/dL (ref >39)
LDL Cholesterol: 104 mg/dL — ABNORMAL HIGH (ref 0–99)
TRIGLYCERIDES: 52 mg/dL (ref <150)
VLDL: 10 mg/dL (ref 0–40)

## 2014-06-08 LAB — TSH: TSH: 0.977 u[IU]/mL (ref 0.350–4.500)

## 2014-06-08 NOTE — Progress Notes (Signed)
Michele SimmondsRhonda Schmidt [birth year] [id number]        48 y.o.  G3P3 for annual exam.  Several issues noted below.  Past medical history,surgical history, problem list, medications, allergies, family history and social history were all reviewed and documented as reviewed in the EPIC chart.  ROS:  12 system ROS performed with pertinent positives and negatives included in the history, assessment and plan.   Additional significant findings :  none   Exam: [facility] personassistant Filed Vitals:   06/08/14 0825  BP: 114/70  Height: 5\' 2"  (1.575 m)  Weight: 147 lb (66.679 kg)   General appearance:  Normal affect, orientation and appearance. Skin: Grossly normal HEENT: Without gross lesions.  No cervical or supraclavicular adenopathy. Thyroid normal.  Lungs:  Clear without wheezing, rales or rhonchi Cardiac: RR, without RMG Abdominal:  Soft, nontender, without masses, guarding, rebound, organomegaly or hernia Breasts:  Examined lying and sitting without masses, retractions, discharge or axillary adenopathy. Pelvic:  Ext/BUS/vagina normal  Cervix normal. IUD strings visualized  Uterus anteverted, normal size, shape and contour, midline and mobile nontender   Adnexa  Without masses or tenderness    Anus and perineum  Normal   Rectovaginal  Normal sphincter tone without palpated masses or tenderness.    Assessment/Plan:  48 y.o. G3P3 female for annual exam without menses, Mirena IUD.   1. Breast cysts, left. Recently had mammogram and ultrasound 04/2014 which showed 2 cysts 1:00 position left breast. I do not clearly feel any abnormalities. She does have dense breasts bilaterally in the tail of [place]. They recommended follow up studies in 1 year. Patient will continue self breast exams and report any palpable abnormalities otherwise follow up in one year. 2. Mirena IUD 05/2012. Doing well without menses. IUD strings visualized. Continue to monitor. 3. Pap smear/HPV negative 04/2012. No Pap smear done  today. No history of abnormal Pap smears previously. Repeat Pap smear at 3-5 year interval. 4. Health maintenance. Baseline CBC comprehensive metabolic panel lipid profile urinalysis TSH ordered due to weight gain from last year.  Follow up in one year, sooner as needed.     Dara LordsFONTAINE,TIMOTHY P MD, 8:50 AM 06/08/2014

## 2014-06-08 NOTE — Patient Instructions (Signed)
You may obtain a copy of any labs that were done today by logging onto MyChart as outlined in the instructions provided with your AVS (after visit summary). The office will not call with normal lab results but certainly if there are any significant abnormalities then we will contact you.   Health Maintenance, Female A healthy lifestyle and preventative care can promote health and wellness.  Maintain regular health, dental, and eye exams.  Eat a healthy diet. Foods like vegetables, fruits, whole grains, low-fat dairy products, and lean protein foods contain the nutrients you need without too many calories. Decrease your intake of foods high in solid fats, added sugars, and salt. Get information about a proper diet from your caregiver, if necessary.  Regular physical exercise is one of the most important things you can do for your health. Most adults should get at least 150 minutes of moderate-intensity exercise (any activity that increases your heart rate and causes you to sweat) each week. In addition, most adults need muscle-strengthening exercises on 2 or more days a week.   Maintain a healthy weight. The body mass index (BMI) is a screening tool to identify possible weight problems. It provides an estimate of body fat based on height and weight. Your caregiver can help determine your BMI, and can help you achieve or maintain a healthy weight. For adults 20 years and older:  A BMI below 18.5 is considered underweight.  A BMI of 18.5 to 24.9 is normal.  A BMI of 25 to 29.9 is considered overweight.  A BMI of 30 and above is considered obese.  Maintain normal blood lipids and cholesterol by exercising and minimizing your intake of saturated fat. Eat a balanced diet with plenty of fruits and vegetables. Blood tests for lipids and cholesterol should begin at age 61 and be repeated every 5 years. If your lipid or cholesterol levels are high, you are over 50, or you are a high risk for heart  disease, you may need your cholesterol levels checked more frequently.Ongoing high lipid and cholesterol levels should be treated with medicines if diet and exercise are not effective.  If you smoke, find out from your caregiver how to quit. If you do not use tobacco, do not start.  Lung cancer screening is recommended for adults aged 33 80 years who are at high risk for developing lung cancer because of a history of smoking. Yearly low-dose computed tomography (CT) is recommended for people who have at least a 30-pack-year history of smoking and are a current smoker or have quit within the past 15 years. A pack year of smoking is smoking an average of 1 pack of cigarettes a day for 1 year (for example: 1 pack a day for 30 years or 2 packs a day for 15 years). Yearly screening should continue until the smoker has stopped smoking for at least 15 years. Yearly screening should also be stopped for people who develop a health problem that would prevent them from having lung cancer treatment.  If you are pregnant, do not drink alcohol. If you are breastfeeding, be very cautious about drinking alcohol. If you are not pregnant and choose to drink alcohol, do not exceed 1 drink per day. One drink is considered to be 12 ounces (355 mL) of beer, 5 ounces (148 mL) of wine, or 1.5 ounces (44 mL) of liquor.  Avoid use of street drugs. Do not share needles with anyone. Ask for help if you need support or instructions about stopping  the use of drugs.  High blood pressure causes heart disease and increases the risk of stroke. Blood pressure should be checked at least every 1 to 2 years. Ongoing high blood pressure should be treated with medicines, if weight loss and exercise are not effective.  If you are 59 to 46 years old, ask your caregiver if you should take aspirin to prevent strokes.  Diabetes screening involves taking a blood sample to check your fasting blood sugar level. This should be done once every 3  years, after age 91, if you are within normal weight and without risk factors for diabetes. Testing should be considered at a younger age or be carried out more frequently if you are overweight and have at least 1 risk factor for diabetes.  Breast cancer screening is essential preventative care for women. You should practice "breast self-awareness." This means understanding the normal appearance and feel of your breasts and may include breast self-examination. Any changes detected, no matter how small, should be reported to a caregiver. Women in their 66s and 30s should have a clinical breast exam (CBE) by a caregiver as part of a regular health exam every 1 to 3 years. After age 101, women should have a CBE every year. Starting at age 100, women should consider having a mammogram (breast X-ray) every year. Women who have a family history of breast cancer should talk to their caregiver about genetic screening. Women at a high risk of breast cancer should talk to their caregiver about having an MRI and a mammogram every year.  Breast cancer gene (BRCA)-related cancer risk assessment is recommended for women who have family members with BRCA-related cancers. BRCA-related cancers include breast, ovarian, tubal, and peritoneal cancers. Having family members with these cancers may be associated with an increased risk for harmful changes (mutations) in the breast cancer genes BRCA1 and BRCA2. Results of the assessment will determine the need for genetic counseling and BRCA1 and BRCA2 testing.  The Pap test is a screening test for cervical cancer. Women should have a Pap test starting at age 57. Between ages 25 and 35, Pap tests should be repeated every 2 years. Beginning at age 37, you should have a Pap test every 3 years as long as the past 3 Pap tests have been normal. If you had a hysterectomy for a problem that was not cancer or a condition that could lead to cancer, then you no longer need Pap tests. If you are  between ages 50 and 76, and you have had normal Pap tests going back 10 years, you no longer need Pap tests. If you have had past treatment for cervical cancer or a condition that could lead to cancer, you need Pap tests and screening for cancer for at least 20 years after your treatment. If Pap tests have been discontinued, risk factors (such as a new sexual partner) need to be reassessed to determine if screening should be resumed. Some women have medical problems that increase the chance of getting cervical cancer. In these cases, your caregiver may recommend more frequent screening and Pap tests.  The human papillomavirus (HPV) test is an additional test that may be used for cervical cancer screening. The HPV test looks for the virus that can cause the cell changes on the cervix. The cells collected during the Pap test can be tested for HPV. The HPV test could be used to screen women aged 44 years and older, and should be used in women of any age  who have unclear Pap test results. After the age of 55, women should have HPV testing at the same frequency as a Pap test.  Colorectal cancer can be detected and often prevented. Most routine colorectal cancer screening begins at the age of 44 and continues through age 20. However, your caregiver may recommend screening at an earlier age if you have risk factors for colon cancer. On a yearly basis, your caregiver may provide home test kits to check for hidden blood in the stool. Use of a small camera at the end of a tube, to directly examine the colon (sigmoidoscopy or colonoscopy), can detect the earliest forms of colorectal cancer. Talk to your caregiver about this at age 86, when routine screening begins. Direct examination of the colon should be repeated every 5 to 10 years through age 13, unless early forms of pre-cancerous polyps or small growths are found.  Hepatitis C blood testing is recommended for all people born from [birth year] through 1963 and any  individual with known risks for hepatitis C.  Practice safe sex. Use condoms and avoid high-risk sexual practices to reduce the spread of sexually transmitted infections (STIs). Sexually active women aged 36 and younger should be checked for Chlamydia, which is a common sexually transmitted infection. Older women with new or multiple partners should also be tested for Chlamydia. Testing for other STIs is recommended if you are sexually active and at increased risk.  Osteoporosis is a disease in which the bones lose minerals and strength with aging. This can result in serious bone fractures. The risk of osteoporosis can be identified using a bone density scan. Women ages 20 and over and women at risk for fractures or osteoporosis should discuss screening with their caregivers. Ask your caregiver whether you should be taking a calcium supplement or vitamin D to reduce the rate of osteoporosis.  Menopause can be associated with physical symptoms and risks. Hormone replacement therapy is available to decrease symptoms and risks. You should talk to your caregiver about whether hormone replacement therapy is right for you.  Use sunscreen. Apply sunscreen liberally and repeatedly throughout the day. You should seek shade when your shadow is shorter than you. Protect yourself by wearing long sleeves, pants, a wide-brimmed hat, and sunglasses year round, whenever you are outdoors.  Notify your caregiver of new moles or changes in moles, especially if there is a change in shape or color. Also notify your caregiver if a mole is larger than the size of a pencil eraser.  Stay current with your immunizations. Document Released: 03/03/2011 Document Revised: 12/13/2012 Document Reviewed: 03/03/2011 [facility] Patient Information 2014 [employer].

## 2014-06-09 LAB — URINE CULTURE
COLONY COUNT: NO GROWTH
ORGANISM ID, BACTERIA: NO GROWTH

## 2014-07-03 ENCOUNTER — Encounter: Payer: Self-pay | Admitting: Gynecology

## 2016-03-31 ENCOUNTER — Other Ambulatory Visit: Payer: Self-pay | Admitting: Gynecology

## 2016-03-31 DIAGNOSIS — Z1231 Encounter for screening mammogram for malignant neoplasm of breast: Secondary | ICD-10-CM

## 2016-04-18 ENCOUNTER — Encounter: Payer: Self-pay | Admitting: Gynecology

## 2016-04-18 ENCOUNTER — Ambulatory Visit (INDEPENDENT_AMBULATORY_CARE_PROVIDER_SITE_OTHER): Payer: BLUE CROSS/BLUE SHIELD | Admitting: Gynecology

## 2016-04-18 VITALS — BP 120/76 | Ht 62.0 in | Wt 137.0 lb

## 2016-04-18 DIAGNOSIS — Z01419 Encounter for gynecological examination (general) (routine) without abnormal findings: Secondary | ICD-10-CM

## 2016-04-18 DIAGNOSIS — N6002 Solitary cyst of left breast: Secondary | ICD-10-CM | POA: Diagnosis not present

## 2016-04-18 DIAGNOSIS — Z13220 Encounter for screening for lipoid disorders: Secondary | ICD-10-CM

## 2016-04-18 DIAGNOSIS — Z113 Encounter for screening for infections with a predominantly sexual mode of transmission: Secondary | ICD-10-CM | POA: Diagnosis not present

## 2016-04-18 LAB — COMPREHENSIVE METABOLIC PANEL
ALBUMIN: 3.9 g/dL (ref 3.6–5.1)
ALK PHOS: 48 U/L (ref 33–115)
ALT: 14 U/L (ref 6–29)
AST: 15 U/L (ref 10–35)
BILIRUBIN TOTAL: 1.2 mg/dL (ref 0.2–1.2)
BUN: 9 mg/dL (ref 7–25)
CALCIUM: 9.5 mg/dL (ref 8.6–10.2)
CO2: 24 mmol/L (ref 20–31)
CREATININE: 0.69 mg/dL (ref 0.50–1.10)
Chloride: 106 mmol/L (ref 98–110)
Glucose, Bld: 82 mg/dL (ref 65–99)
Potassium: 4.6 mmol/L (ref 3.5–5.3)
SODIUM: 139 mmol/L (ref 135–146)
TOTAL PROTEIN: 7.1 g/dL (ref 6.1–8.1)

## 2016-04-18 LAB — LIPID PANEL
CHOLESTEROL: 169 mg/dL (ref 125–200)
HDL: 58 mg/dL (ref >=46)
LDL CALC: 100 mg/dL (ref <130)
TRIGLYCERIDES: 54 mg/dL (ref <150)
Total CHOL/HDL Ratio: 2.9 Ratio (ref <=5.0)
VLDL: 11 mg/dL (ref <30)

## 2016-04-18 LAB — CBC WITH DIFFERENTIAL/PLATELET
Basophils Absolute: 57 cells/uL (ref 0–200)
Basophils Relative: 1 %
EOS PCT: 9 %
Eosinophils Absolute: 513 cells/uL — ABNORMAL HIGH (ref 15–500)
HEMATOCRIT: 41.7 % (ref 35.0–45.0)
HEMOGLOBIN: 14 g/dL (ref 11.7–15.5)
LYMPHS ABS: 1938 {cells}/uL (ref 850–3900)
Lymphocytes Relative: 34 %
MCH: 30.6 pg (ref 27.0–33.0)
MCHC: 33.6 g/dL (ref 32.0–36.0)
MCV: 91 fL (ref 80.0–100.0)
MONO ABS: 513 {cells}/uL (ref 200–950)
MPV: 9.3 fL (ref 7.5–12.5)
Monocytes Relative: 9 %
NEUTROS ABS: 2679 {cells}/uL (ref 1500–7800)
NEUTROS PCT: 47 %
Platelets: 312 10*3/uL (ref 140–400)
RBC: 4.58 MIL/uL (ref 3.80–5.10)
RDW: 12.9 % (ref 11.0–15.0)
WBC: 5.7 10*3/uL (ref 3.8–10.8)

## 2016-04-18 LAB — HEPATITIS C ANTIBODY: HCV AB: NEGATIVE

## 2016-04-18 LAB — HIV ANTIBODY (ROUTINE TESTING W REFLEX): HIV 1&2 Ab, 4th Generation: NONREACTIVE

## 2016-04-18 LAB — HEPATITIS B SURFACE ANTIGEN: HEP B S AG: NEGATIVE

## 2016-04-18 NOTE — Progress Notes (Addendum)
    Michele SimmondsRhonda Schmidt 01/28/1966 [id number]        50 y.o.  G3P3  for annual exam.  Doing well. Patient does note lump in her left breast noticed recently. No tenderness.  Past medical history,surgical history, problem list, medications, allergies, family history and social history were all reviewed and documented as reviewed in the EPIC chart.  ROS:  Performed with pertinent positives and negatives included in the history, assessment and plan.   Additional significant findings :  None   Exam: Kennon PortelaKim Schmidt assistant Vitals:   04/18/16 0803  BP: 120/76  Weight: 137 lb (62.1 kg)  Height: 5\' 2"  (1.575 m)   Body mass index is 25.06 kg/m.  General appearance:  Normal affect, orientation and appearance. Skin: Grossly normal HEENT: Without gross lesions.  No cervical or supraclavicular adenopathy. Thyroid normal.  Lungs:  Clear without wheezing, rales or rhonchi Cardiac: RR, without RMG Abdominal:  Soft, nontender, without masses, guarding, rebound, organomegaly or hernia Breasts:  Examined lying and sitting. Right without masses, retractions, discharge or axillary adenopathy. Left with firm 2-3 cm mass 9 to 10:00 position 1 fingerbreadth off the areola. Mobile well-circumscribed with no overlying skin changes. No nipple discharge or axillary adenopathy. Physical Exam  Pulmonary/Chest:      Procedure note late entry: Left breast cyst was aspirated after alcohol cleansing of the skin with normal-appearing cyst fluid which was discarded. Palpable area completely resolved after aspiration.  Pelvic:  Ext/BUS/Vagina normal  Cervix normal. Pap smear, GC/chlamydia. IUD string   Uterus anteverted, normal size, shape and contour, midline and mobile nontender   Adnexa without masses or tenderness    Anus and perineum normal   Rectovaginal normal sphincter tone without palpated masses or tenderness.    Assessment/Plan:  50 y.o. G3P3 female for annual exam with irregular menses, Mirena  IUD.   1. Mirena IUD 05/2012.  Having irregular menses tolerable to the patient. 2. Left breast cyst aspirated as above. Will plan mammogram in another month or 2 as she is overdue and will allow the inflammatory changes from the aspiration and cyst to resolve. She knows the importance of scheduling the mammogram. SBE monthly reviewed. 3. Pap smear/HPV 2013. Pap smear done today. No history of abnormal Pap smears previously. 4. STD screening. Patient requests STD screening. No known exposure but wants to be screened. GC/Chlamydia, HIV, RPR, hepatitis B, hepatitis C done. 5. Health maintenance. Patient requests baseline labs. CBC, CMP, lipid profile, urinalysis ordered with above STD blood work. Follow up in one year, sooner as needed.  Dara LordsFONTAINE,Michele Schmidt P MD, 8:29 AM 04/18/2016

## 2016-04-18 NOTE — Addendum Note (Signed)
Addended by: Dayna BarkerGARDNER, Akira Adelsberger K on: 04/18/2016 09:09 AM   Modules accepted: Orders

## 2016-04-19 LAB — URINALYSIS W MICROSCOPIC + REFLEX CULTURE
Bilirubin Urine: NEGATIVE
CASTS: NONE SEEN [LPF]
Crystals: NONE SEEN [HPF]
Glucose, UA: NEGATIVE
Leukocytes, UA: NEGATIVE
NITRITE: NEGATIVE
Specific Gravity, Urine: 1.024 (ref 1.001–1.035)
YEAST: NONE SEEN [HPF]
pH: 6 (ref 5.0–8.0)

## 2016-04-19 LAB — GC/CHLAMYDIA PROBE AMP
CT Probe RNA: NOT DETECTED
GC Probe RNA: NOT DETECTED

## 2016-04-19 LAB — RPR

## 2016-04-20 LAB — URINE CULTURE: ORGANISM ID, BACTERIA: NO GROWTH

## 2016-04-21 LAB — PAP IG W/ RFLX HPV ASCU

## 2016-05-12 ENCOUNTER — Other Ambulatory Visit: Payer: Self-pay | Admitting: Gynecology

## 2016-05-12 DIAGNOSIS — N63 Unspecified lump in unspecified breast: Secondary | ICD-10-CM

## 2016-05-26 ENCOUNTER — Ambulatory Visit
Admission: RE | Admit: 2016-05-26 | Discharge: 2016-05-26 | Disposition: A | Discharge status: Home / Self Care | Payer: BLUE CROSS/BLUE SHIELD | Source: Ambulatory Visit | Attending: Gynecology | Admitting: Gynecology

## 2016-05-26 DIAGNOSIS — N63 Unspecified lump in unspecified breast: Secondary | ICD-10-CM

## 2017-04-20 ENCOUNTER — Encounter: Payer: BLUE CROSS/BLUE SHIELD | Admitting: Gynecology

## 2017-04-21 ENCOUNTER — Encounter: Payer: Self-pay | Admitting: Gynecology

## 2017-04-21 ENCOUNTER — Ambulatory Visit (INDEPENDENT_AMBULATORY_CARE_PROVIDER_SITE_OTHER): Payer: BLUE CROSS/BLUE SHIELD | Admitting: Gynecology

## 2017-04-21 VITALS — BP 114/66 | Ht 62.0 in | Wt 151.0 lb

## 2017-04-21 DIAGNOSIS — Z30431 Encounter for routine checking of intrauterine contraceptive device: Secondary | ICD-10-CM

## 2017-04-21 DIAGNOSIS — Z113 Encounter for screening for infections with a predominantly sexual mode of transmission: Secondary | ICD-10-CM

## 2017-04-21 DIAGNOSIS — Z01419 Encounter for gynecological examination (general) (routine) without abnormal findings: Secondary | ICD-10-CM

## 2017-04-21 DIAGNOSIS — Z13220 Encounter for screening for lipoid disorders: Secondary | ICD-10-CM | POA: Diagnosis not present

## 2017-04-21 LAB — CBC WITH DIFFERENTIAL/PLATELET
BASOS ABS: 73 {cells}/uL (ref 0–200)
BASOS PCT: 1 %
Eosinophils Absolute: 511 cells/uL — ABNORMAL HIGH (ref 15–500)
Eosinophils Relative: 7 %
HEMATOCRIT: 44.1 % (ref 35.0–45.0)
Hemoglobin: 14.8 g/dL (ref 11.7–15.5)
LYMPHS ABS: 2190 {cells}/uL (ref 850–3900)
Lymphocytes Relative: 30 %
MCH: 31.2 pg (ref 27.0–33.0)
MCHC: 33.6 g/dL (ref 32.0–36.0)
MCV: 93 fL (ref 80.0–100.0)
MONO ABS: 657 {cells}/uL (ref 200–950)
MONOS PCT: 9 %
MPV: 9.6 fL (ref 7.5–12.5)
Neutro Abs: 3869 cells/uL (ref 1500–7800)
Neutrophils Relative %: 53 %
PLATELETS: 318 10*3/uL (ref 140–400)
RBC: 4.74 MIL/uL (ref 3.80–5.10)
RDW: 12.6 % (ref 11.0–15.0)
WBC: 7.3 10*3/uL (ref 3.8–10.8)

## 2017-04-21 NOTE — Progress Notes (Signed)
    Michele Schmidt 1966-08-08 [id number]        51 y.o.  G3P3 for annual exam.    Past medical history,surgical history, problem list, medications, allergies, family history and social history were all reviewed and documented as reviewed in the EPIC chart.  ROS:  Performed with pertinent positives and negatives included in the history, assessment and plan.   Additional significant findings :  None   Exam: Kennon Portela assistant Vitals:   04/21/17 1523  BP: 114/66  Weight: 151 lb (68.5 kg)  Height: 5\' 2"  (1.575 m)   Body mass index is 27.62 kg/m.  General appearance:  Normal affect, orientation and appearance. Skin: Grossly normal HEENT: Without gross lesions.  No cervical or supraclavicular adenopathy. Thyroid normal.  Lungs:  Clear without wheezing, rales or rhonchi Cardiac: RR, without RMG Abdominal:  Soft, nontender, without masses, guarding, rebound, organomegaly or hernia Breasts:  Examined lying and sitting without masses, retractions, discharge or axillary adenopathy. Pelvic:  Ext, BUS, Vagina: Normal  Cervix: Normal. IUD string palpated  Uterus: Anteverted, normal size, shape and contour, midline and mobile nontender   Adnexa: Without masses or tenderness    Anus and perineum: Normal   Rectovaginal: Normal sphincter tone without palpated masses or tenderness.    Assessment/Plan:  51 y.o. G3P3 female for annual exam with mildly irregular menses, Mirena IUD.   1. Mirena IUD 05/2012. Due to be replaced next month and patients clinical make an appointment for this. 2. Mammography 05/2016. Continue with annual mammography next month. Patient will schedule. Breast exam normal today. 3. Pap smear 2017 normal. No Pap smear done today. No history of abnormal Pap smears previously. 4. STD screening. Patient requests STD screening. No specific exposure. GC/Chlamydia, HIV, RPR, hepatitis B, hepatitis C done. 5. Health maintenance. Patient requests baseline labs. CBC, CMP,  lipid profile ordered. Follow up for IUD switched next month. Otherwise annual exam in one year.   Dara Lords MD, 3:46 PM 04/21/2017

## 2017-04-21 NOTE — Patient Instructions (Signed)
Follow up for IUD switch next month

## 2017-04-22 ENCOUNTER — Other Ambulatory Visit: Payer: Self-pay | Admitting: *Deleted

## 2017-04-22 DIAGNOSIS — E7800 Pure hypercholesterolemia, unspecified: Secondary | ICD-10-CM

## 2017-04-22 LAB — COMPREHENSIVE METABOLIC PANEL
ALK PHOS: 55 U/L (ref 33–115)
ALT: 8 U/L (ref 6–29)
AST: 11 U/L (ref 10–35)
Albumin: 4.3 g/dL (ref 3.6–5.1)
BUN: 10 mg/dL (ref 7–25)
CALCIUM: 9.5 mg/dL (ref 8.6–10.2)
CHLORIDE: 103 mmol/L (ref 98–110)
CO2: 23 mmol/L (ref 20–32)
Creat: 0.67 mg/dL (ref 0.50–1.10)
GLUCOSE: 79 mg/dL (ref 65–99)
POTASSIUM: 3.8 mmol/L (ref 3.5–5.3)
Sodium: 137 mmol/L (ref 135–146)
Total Bilirubin: 0.9 mg/dL (ref 0.2–1.2)
Total Protein: 7.3 g/dL (ref 6.1–8.1)

## 2017-04-22 LAB — LIPID PANEL
CHOLESTEROL: 194 mg/dL (ref <200)
HDL: 60 mg/dL (ref >50)
LDL CALC: 120 mg/dL — AB (ref <100)
TRIGLYCERIDES: 68 mg/dL (ref <150)
Total CHOL/HDL Ratio: 3.2 Ratio (ref <5.0)
VLDL: 14 mg/dL (ref <30)

## 2017-04-22 LAB — GC/CHLAMYDIA PROBE AMP
CT Probe RNA: NOT DETECTED
GC Probe RNA: NOT DETECTED

## 2017-04-22 LAB — HIV ANTIBODY (ROUTINE TESTING W REFLEX): HIV 1&2 Ab, 4th Generation: NONREACTIVE

## 2017-04-22 LAB — HEPATITIS C ANTIBODY: HCV AB: NONREACTIVE

## 2017-04-22 LAB — HEPATITIS B SURFACE ANTIGEN: HEP B S AG: NONREACTIVE

## 2017-04-22 LAB — RPR

## 2017-05-02 HISTORY — PX: INTRAUTERINE DEVICE INSERTION: SHX323

## 2017-05-13 ENCOUNTER — Telehealth: Payer: Self-pay

## 2017-05-13 MED ORDER — SULFAMETHOXAZOLE-TRIMETHOPRIM 800-160 MG PO TABS: 1.0000 | ORAL_TABLET | Freq: Two times a day (BID) | ORAL | 0 refills | Status: DC

## 2017-05-13 NOTE — Telephone Encounter (Signed)
Patient said she has appt with you tomorrow for Mirena IUD insertion. Today she feels beginning of UTI. Familiar with symptoms. She asked if anyway she could get Rx today and not have to wait until visit tomorrow?

## 2017-05-13 NOTE — Telephone Encounter (Signed)
Rx sent. Patient informed. 

## 2017-05-13 NOTE — Telephone Encounter (Signed)
Septra DS 1 by mouth twice a day 3 days. 

## 2017-05-14 ENCOUNTER — Encounter: Payer: Self-pay | Admitting: Gynecology

## 2017-05-14 ENCOUNTER — Ambulatory Visit (INDEPENDENT_AMBULATORY_CARE_PROVIDER_SITE_OTHER): Payer: BLUE CROSS/BLUE SHIELD | Admitting: Gynecology

## 2017-05-14 VITALS — BP 124/78

## 2017-05-14 DIAGNOSIS — Z30433 Encounter for removal and reinsertion of intrauterine contraceptive device: Secondary | ICD-10-CM | POA: Diagnosis not present

## 2017-05-14 MED ORDER — ALPRAZOLAM 0.25 MG PO TABS: 0.2500 mg | ORAL_TABLET | Freq: Every evening | ORAL | 0 refills | Status: DC | PRN

## 2017-05-14 NOTE — Progress Notes (Signed)
    Michele SimmondsRhonda Schmidt [birth year] [id number]        51 y.o.  G3P3  presents for Mirena IUD removal and replacement. She has read through the booklet, has no contraindications and signed the consent form.  I reviewed the removal and insertional process with her as well as the risks to include infection, either immediate or long-term, uterine perforation or migration requiring surgery to remove, other complications such as pain, hormonal side effects and possibility of failure with subsequent pregnancy.   Exam with Bari MantisKim Alexis assistant Vitals:   05/14/17 1422  BP: 124/78    Pelvic: External BUS vagina normal. Cervix normal. IUD string not visualized. Uterus anteverted normal size shape contour midline mobile nontender. Adnexa without masses or tenderness.  Procedure: The cervix was visualized with a speculum. [facility] used to retrieve the IUD string from the endocervical canal and her Mirena IUD was removed, shown to the patient and discarded. The cervix was then cleansed with Betadine, anterior lip grasped with a single-tooth tenaculum, the uterus was sounded and a new Mirena IUD was placed according to manufacturer's recommendations without difficulty. The strings were trimmed. The patient tolerated well and will follow up in one month for a postinsertional check.  Lot number:  [id number]    Dara LordsFONTAINE,Korrin Waterfield P MD, 3:40 PM 05/14/2017

## 2017-05-14 NOTE — Patient Instructions (Signed)

## 2017-05-19 ENCOUNTER — Encounter: Payer: Self-pay | Admitting: Gynecology

## 2017-06-05 ENCOUNTER — Ambulatory Visit: Payer: BLUE CROSS/BLUE SHIELD | Admitting: Gynecology

## 2017-06-12 ENCOUNTER — Encounter: Payer: Self-pay | Admitting: Gynecology

## 2017-06-12 ENCOUNTER — Ambulatory Visit (INDEPENDENT_AMBULATORY_CARE_PROVIDER_SITE_OTHER): Payer: BLUE CROSS/BLUE SHIELD | Admitting: Gynecology

## 2017-06-12 VITALS — BP 122/78

## 2017-06-12 DIAGNOSIS — Z30431 Encounter for routine checking of intrauterine contraceptive device: Secondary | ICD-10-CM | POA: Diagnosis not present

## 2017-06-12 NOTE — Progress Notes (Signed)
    Michele Schmidt 09-07-1965 [id number]        51 y.o.  G3P3 presents for follow up IUD check. Had Mirena IUD replaced 05/14/2017. Doing well without complaints.  Past medical history,surgical history, problem list, medications, allergies, family history and social history were all reviewed and documented in the EPIC chart.  Directed ROS with pertinent positives and negatives documented in the history of present illness/assessment and plan.  Exam: Michele Schmidt assistant Vitals:   06/12/17 1129  BP: 122/78   General appearance:  Normal Abdomen soft nontender without masses guarding rebound Pelvic external BUS vagina normal. Cervix normal. IUD string visualized using colposcopy. Uterus grossly normal midline mobile nontender. Adnexa without masses or tenderness.   Assessment/Plan:  51 y.o. G3P3 with normal IUD follow up exam. [name] continues well then she'll follow up next August when due for annual exam, sooner as needed.    Michele Lords MD, 11:49 AM 06/12/2017

## 2017-06-12 NOTE — Patient Instructions (Signed)
Follow up for annual exam in August 2019, sooner if any issues.

## 2017-06-15 ENCOUNTER — Other Ambulatory Visit: Payer: Self-pay | Admitting: Gynecology

## 2017-06-15 DIAGNOSIS — Z1231 Encounter for screening mammogram for malignant neoplasm of breast: Secondary | ICD-10-CM

## 2017-07-01 ENCOUNTER — Ambulatory Visit
Admission: RE | Admit: 2017-07-01 | Discharge: 2017-07-01 | Disposition: A | Discharge status: Home / Self Care | Payer: BLUE CROSS/BLUE SHIELD | Source: Ambulatory Visit | Attending: Gynecology | Admitting: Gynecology

## 2017-07-01 DIAGNOSIS — Z1231 Encounter for screening mammogram for malignant neoplasm of breast: Secondary | ICD-10-CM

## 2018-06-24 ENCOUNTER — Encounter: Payer: BLUE CROSS/BLUE SHIELD | Admitting: Gynecology

## 2018-09-07 ENCOUNTER — Other Ambulatory Visit: Payer: Self-pay | Admitting: Gynecology

## 2018-09-07 DIAGNOSIS — Z1231 Encounter for screening mammogram for malignant neoplasm of breast: Secondary | ICD-10-CM

## 2018-10-07 ENCOUNTER — Ambulatory Visit
Admission: RE | Admit: 2018-10-07 | Discharge: 2018-10-07 | Disposition: A | Discharge status: Home / Self Care | Payer: BLUE CROSS/BLUE SHIELD | Source: Ambulatory Visit | Attending: Gynecology | Admitting: Gynecology

## 2018-10-07 DIAGNOSIS — Z1231 Encounter for screening mammogram for malignant neoplasm of breast: Secondary | ICD-10-CM

## 2019-05-25 ENCOUNTER — Encounter: Payer: Self-pay | Admitting: Gynecology

## 2019-06-28 ENCOUNTER — Other Ambulatory Visit: Payer: Self-pay

## 2019-06-29 ENCOUNTER — Ambulatory Visit (INDEPENDENT_AMBULATORY_CARE_PROVIDER_SITE_OTHER): Payer: Managed Care, Other (non HMO) | Admitting: Gynecology

## 2019-06-29 ENCOUNTER — Encounter: Payer: Self-pay | Admitting: Gynecology

## 2019-06-29 VITALS — BP 118/76 | Ht 62.0 in | Wt 147.0 lb

## 2019-06-29 DIAGNOSIS — Z01419 Encounter for gynecological examination (general) (routine) without abnormal findings: Secondary | ICD-10-CM

## 2019-06-29 DIAGNOSIS — Z113 Encounter for screening for infections with a predominantly sexual mode of transmission: Secondary | ICD-10-CM

## 2019-06-29 DIAGNOSIS — Z1151 Encounter for screening for human papillomavirus (HPV): Secondary | ICD-10-CM

## 2019-06-29 DIAGNOSIS — Z13220 Encounter for screening for lipoid disorders: Secondary | ICD-10-CM | POA: Diagnosis not present

## 2019-06-29 DIAGNOSIS — Z30431 Encounter for routine checking of intrauterine contraceptive device: Secondary | ICD-10-CM

## 2019-06-29 NOTE — Progress Notes (Signed)
    Anastasiya Gowin 1965/10/05 [id number]        53 y.o.  G3P3 for annual gynecologic exam.  Has Mirena IUD placed 2018.  Continues to have monthly menses.  Past medical history,surgical history, problem list, medications, allergies, family history and social history were all reviewed and documented as reviewed in the EPIC chart.  ROS:  Performed with pertinent positives and negatives included in the history, assessment and plan.   Additional significant findings : None   Exam: Caryn Bee assistant Vitals:   06/29/19 1200  BP: 118/76  Weight: 147 lb (66.7 kg)  Height: 5\' 2"  (1.575 m)   Body mass index is 26.89 kg/m.  General appearance:  Normal affect, orientation and appearance. Skin: Grossly normal HEENT: Without gross lesions.  No cervical or supraclavicular adenopathy. Thyroid normal.  Lungs:  Clear without wheezing, rales or rhonchi Cardiac: RR, without RMG Abdominal:  Soft, nontender, without masses, guarding, rebound, organomegaly or hernia Breasts:  Examined lying and sitting without masses, retractions, discharge or axillary adenopathy. Pelvic:  Ext, BUS, Vagina: Normal  Cervix: Normal.  IUD string not visualized.  Pap smear/HPV, GC/chlamydia done  Uterus: Anteverted, normal size, shape and contour, midline and mobile nontender   Adnexa: Without masses or tenderness    Anus and perineum: Normal   Rectovaginal: Normal sphincter tone without palpated masses or tenderness.    Assessment/Plan:  53 y.o. G3P3 female for annual gynecologic exam.  With regular menses, Mirena IUD  1. Mirena IUD 05/2017.  IUD string not visualized.  Recommend ultrasound to document intrauterine placement.  Patient will schedule in follow-up for this. 2. Mammography 10/2018.  Continue with annual mammography when due.  Breast exam normal today. 3. Pap smear 2017.  Pap smear/HPV today.  No history of abnormal Pap smears previously. 4. STD screening requested.  GC/chlamydia screen done  with her Pap smear.  HIV RPR hepatitis B hepatitis C done. 5. Health maintenance.  Requests baseline labs.  CBC, CMP, lipid profile and TSH ordered along with her STD screening.  Follow-up for ultrasound.  Follow-up in 1 year for annual exam.   Anastasio Auerbach MD, 12:25 PM 06/29/2019

## 2019-06-29 NOTE — Addendum Note (Signed)
Addended by: Nelva Nay on: 06/29/2019 12:52 PM   Modules accepted: Orders

## 2019-06-29 NOTE — Addendum Note (Signed)
Addended by: Anastasio Auerbach on: 06/29/2019 04:03 PM   Modules accepted: Orders

## 2019-06-29 NOTE — Patient Instructions (Signed)
Follow-up for the ultrasound as scheduled. 

## 2019-06-30 LAB — COMPREHENSIVE METABOLIC PANEL
AG Ratio: 1.4 (calc) (ref 1.0–2.5)
ALT: 7 U/L (ref 6–29)
AST: 13 U/L (ref 10–35)
Albumin: 4.2 g/dL (ref 3.6–5.1)
Alkaline phosphatase (APISO): 52 U/L (ref 37–153)
BUN: 11 mg/dL (ref 7–25)
CO2: 25 mmol/L (ref 20–32)
Calcium: 9.1 mg/dL (ref 8.6–10.4)
Chloride: 104 mmol/L (ref 98–110)
Creat: 0.68 mg/dL (ref 0.50–1.05)
Globulin: 3.1 g/dL (calc) (ref 1.9–3.7)
Glucose, Bld: 87 mg/dL (ref 65–99)
Potassium: 3.7 mmol/L (ref 3.5–5.3)
Sodium: 136 mmol/L (ref 135–146)
Total Bilirubin: 0.8 mg/dL (ref 0.2–1.2)
Total Protein: 7.3 g/dL (ref 6.1–8.1)

## 2019-06-30 LAB — PAP IG, CT-NG NAA, HPV HIGH-RISK
C. trachomatis RNA, TMA: NOT DETECTED
HPV DNA High Risk: NOT DETECTED
N. gonorrhoeae RNA, TMA: NOT DETECTED

## 2019-06-30 LAB — HIV ANTIBODY (ROUTINE TESTING W REFLEX): HIV 1&2 Ab, 4th Generation: NONREACTIVE

## 2019-06-30 LAB — LIPID PANEL
Cholesterol: 192 mg/dL (ref <200)
HDL: 52 mg/dL (ref >=50)
LDL Cholesterol (Calc): 120 mg/dL (calc) — ABNORMAL HIGH
Non-HDL Cholesterol (Calc): 140 mg/dL (calc) — ABNORMAL HIGH (ref <130)
Total CHOL/HDL Ratio: 3.7 (calc) (ref <5.0)
Triglycerides: 102 mg/dL (ref <150)

## 2019-06-30 LAB — CBC WITH DIFFERENTIAL/PLATELET
Absolute Monocytes: 654 cells/uL (ref 200–950)
Basophils Absolute: 60 cells/uL (ref 0–200)
Basophils Relative: 1 %
Eosinophils Absolute: 474 cells/uL (ref 15–500)
Eosinophils Relative: 7.9 %
HCT: 40.7 % (ref 35.0–45.0)
Hemoglobin: 14.1 g/dL (ref 11.7–15.5)
Lymphs Abs: 2346 cells/uL (ref 850–3900)
MCH: 31.1 pg (ref 27.0–33.0)
MCHC: 34.6 g/dL (ref 32.0–36.0)
MCV: 89.6 fL (ref 80.0–100.0)
MPV: 10.2 fL (ref 7.5–12.5)
Monocytes Relative: 10.9 %
Neutro Abs: 2466 cells/uL (ref 1500–7800)
Neutrophils Relative %: 41.1 %
Platelets: 303 10*3/uL (ref 140–400)
RBC: 4.54 10*6/uL (ref 3.80–5.10)
RDW: 12.1 % (ref 11.0–15.0)
Total Lymphocyte: 39.1 %
WBC: 6 10*3/uL (ref 3.8–10.8)

## 2019-06-30 LAB — HEPATITIS B SURFACE ANTIGEN: Hepatitis B Surface Ag: NONREACTIVE

## 2019-06-30 LAB — TSH: TSH: 1.02 mIU/L

## 2019-06-30 LAB — HEPATITIS C ANTIBODY
Hepatitis C Ab: NONREACTIVE
SIGNAL TO CUT-OFF: 0.02 (ref <1.00)

## 2019-06-30 LAB — RPR: RPR Ser Ql: NONREACTIVE

## 2019-07-06 ENCOUNTER — Other Ambulatory Visit: Payer: Self-pay

## 2019-07-07 ENCOUNTER — Ambulatory Visit (INDEPENDENT_AMBULATORY_CARE_PROVIDER_SITE_OTHER): Payer: Managed Care, Other (non HMO)

## 2019-07-07 ENCOUNTER — Encounter: Payer: Self-pay | Admitting: Gynecology

## 2019-07-07 ENCOUNTER — Ambulatory Visit (INDEPENDENT_AMBULATORY_CARE_PROVIDER_SITE_OTHER): Payer: Managed Care, Other (non HMO) | Admitting: Gynecology

## 2019-07-07 VITALS — BP 118/76

## 2019-07-07 DIAGNOSIS — Z30431 Encounter for routine checking of intrauterine contraceptive device: Secondary | ICD-10-CM

## 2019-07-07 DIAGNOSIS — T8332XA Displacement of intrauterine contraceptive device, initial encounter: Secondary | ICD-10-CM | POA: Diagnosis not present

## 2019-07-07 NOTE — Patient Instructions (Signed)
Follow-up next year when you are due for your annual exam. 

## 2019-07-07 NOTE — Progress Notes (Addendum)
    Michele Schmidt October 10, 1965 [id number]        53 y.o.  G3P3 presents for ultrasound for IUD check.  IUD string not visualized at her last appointment.  Past medical history,surgical history, problem list, medications, allergies, family history and social history were all reviewed and documented in the EPIC chart.  Directed ROS with pertinent positives and negatives documented in the history of present illness/assessment and plan.  Exam: Vitals:   07/07/19 1149  BP: 118/76   General appearance:  Normal  Ultrasound transvaginal shows uterus generous in size with inhomogeneous myometrium suggesting adenomyosis.  No distinct myometrial abnormalities.  Endometrial echo of thin at 2.84 mm.  IUD visualized and in proper location.  Right and left ovaries with physiologic changes.  No free fluid noted.  Cul-de-sac negative.  Assessment/Plan:  53 y.o. G3P3 with normal IUD follow-up ultrasound showing proper location.  Patient will follow-up in 1 year when due for her annual exam, sooner if any issues.    Anastasio Auerbach MD, 11:59 AM 07/07/2019

## 2019-09-14 ENCOUNTER — Other Ambulatory Visit: Payer: Self-pay | Admitting: Obstetrics & Gynecology

## 2019-09-14 DIAGNOSIS — Z1231 Encounter for screening mammogram for malignant neoplasm of breast: Secondary | ICD-10-CM

## 2019-11-11 ENCOUNTER — Ambulatory Visit: Payer: Managed Care, Other (non HMO)

## 2019-11-25 ENCOUNTER — Other Ambulatory Visit: Payer: Self-pay

## 2019-11-25 ENCOUNTER — Ambulatory Visit
Admission: RE | Admit: 2019-11-25 | Discharge: 2019-11-25 | Disposition: A | Discharge status: Home / Self Care | Payer: 59 | Source: Ambulatory Visit | Attending: Obstetrics & Gynecology | Admitting: Obstetrics & Gynecology

## 2019-11-25 DIAGNOSIS — Z1231 Encounter for screening mammogram for malignant neoplasm of breast: Secondary | ICD-10-CM

## 2020-07-02 ENCOUNTER — Encounter: Payer: Managed Care, Other (non HMO) | Admitting: Nurse Practitioner

## 2020-11-16 ENCOUNTER — Other Ambulatory Visit: Payer: Self-pay | Admitting: Obstetrics and Gynecology

## 2020-11-16 DIAGNOSIS — Z1231 Encounter for screening mammogram for malignant neoplasm of breast: Secondary | ICD-10-CM

## 2020-11-27 ENCOUNTER — Ambulatory Visit
Admission: RE | Admit: 2020-11-27 | Discharge: 2020-11-27 | Disposition: A | Discharge status: Home / Self Care | Payer: 59 | Source: Ambulatory Visit | Attending: Obstetrics and Gynecology | Admitting: Obstetrics and Gynecology

## 2020-11-27 ENCOUNTER — Other Ambulatory Visit: Payer: Self-pay

## 2020-11-27 DIAGNOSIS — Z1231 Encounter for screening mammogram for malignant neoplasm of breast: Secondary | ICD-10-CM

## 2021-08-12 ENCOUNTER — Other Ambulatory Visit: Payer: Self-pay | Admitting: Obstetrics and Gynecology

## 2022-01-19 IMAGING — MG MM DIGITAL SCREENING BILAT W/ TOMO AND CAD
8 series · 8 of 24 positions shown · non-contrast
Comparison: Previous exam(s).

CLINICAL DATA: Screening.

EXAM:
DIGITAL SCREENING BILATERAL MAMMOGRAM WITH TOMOSYNTHESIS AND CAD
TECHNIQUE: Bilateral screening digital craniocaudal and mediolateral oblique
mammograms were obtained. Bilateral screening digital breast
tomosynthesis was performed. The images were evaluated with
computer-aided detection.

[L MLO synth-2D]
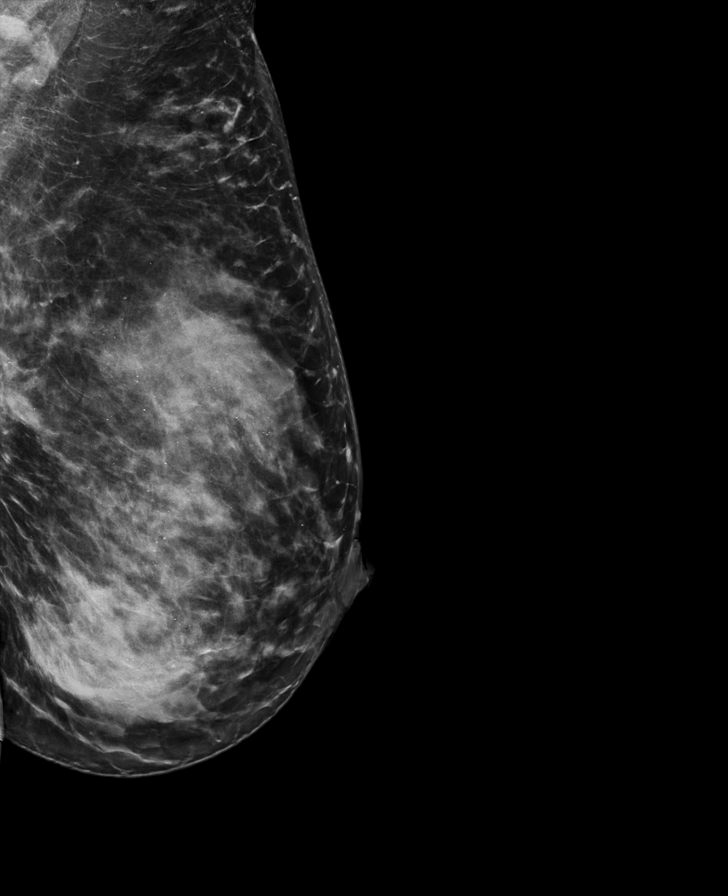

[R MLO synth-2D]
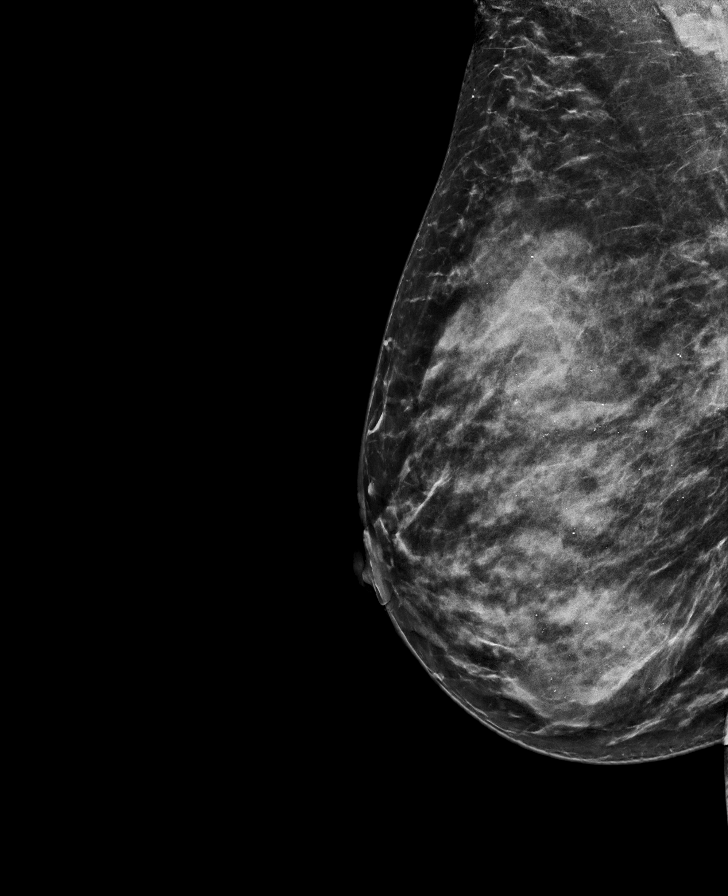

[L CC synth-2D]
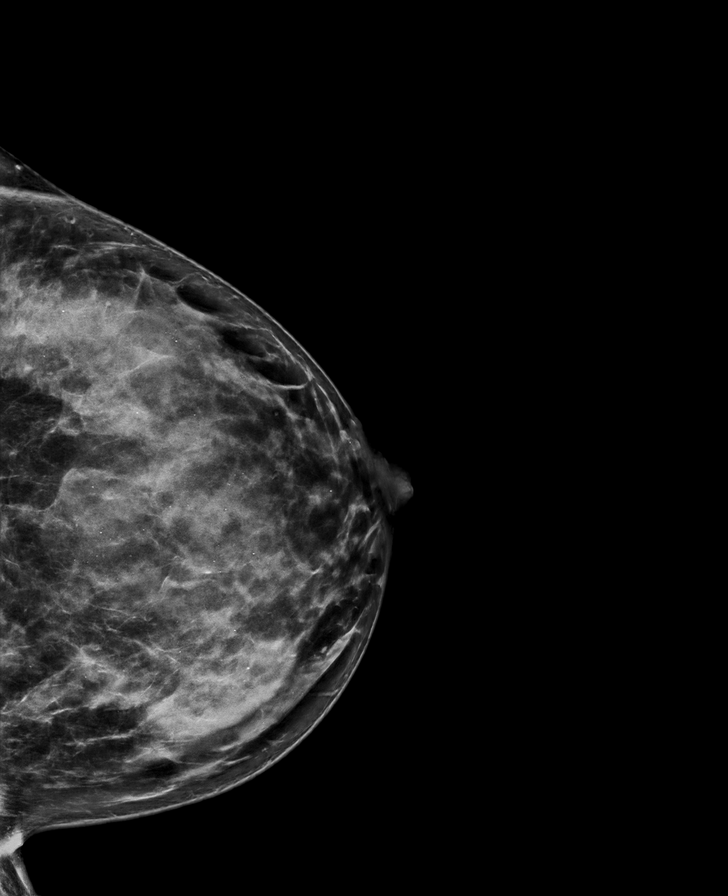

[R CC synth-2D]
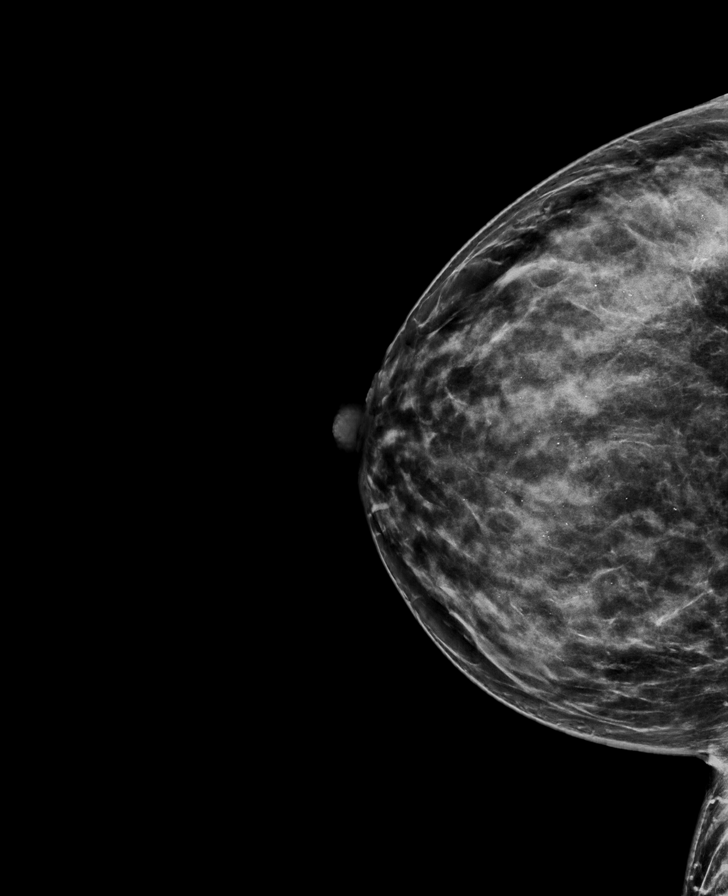

[R CC tomo · tomo slice 37/73.0]
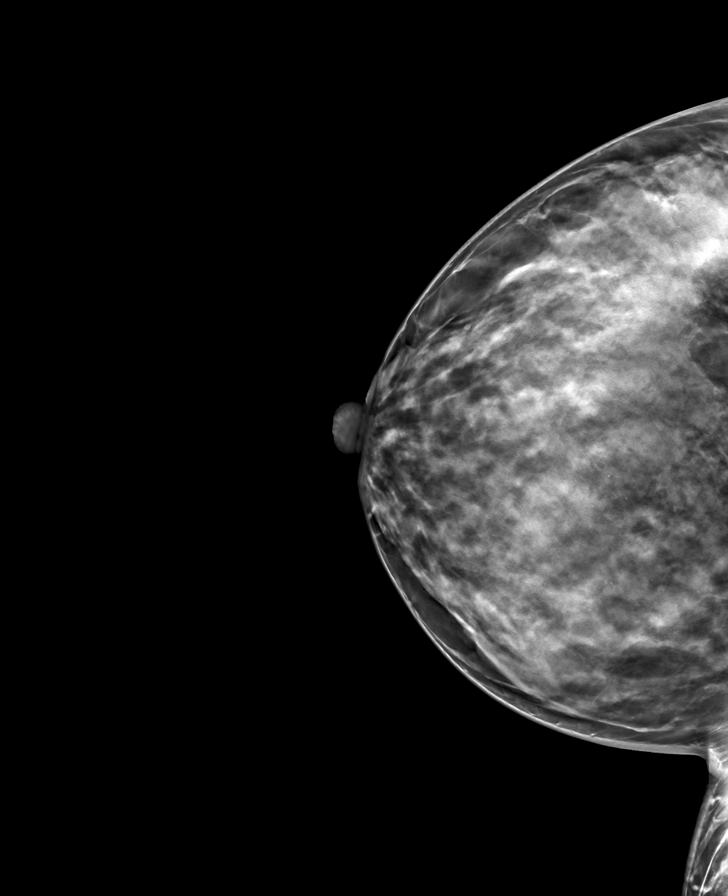

[L MLO tomo · tomo slice 43/84.0]
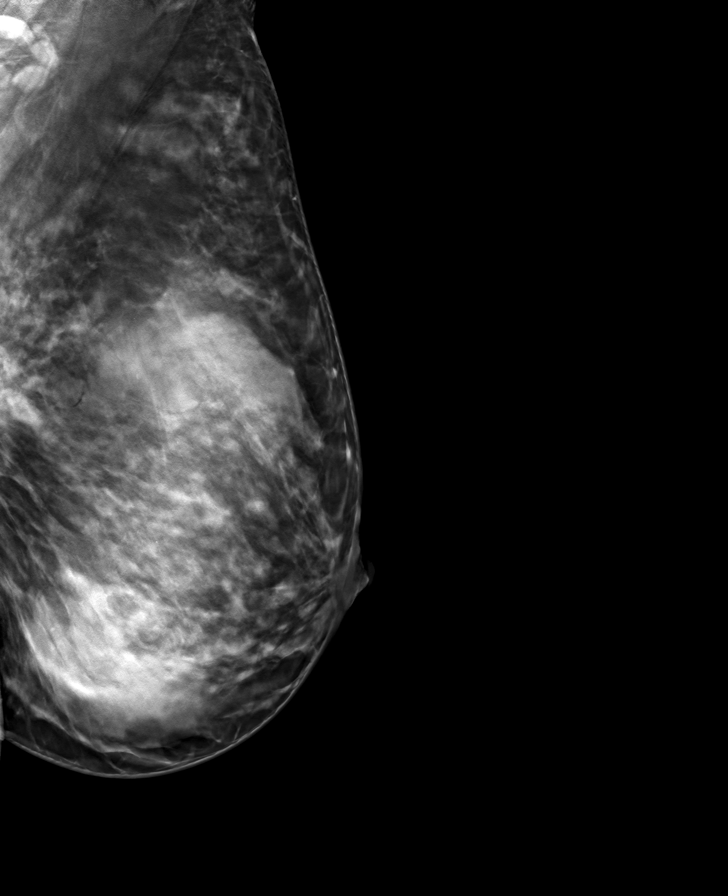

[R MLO tomo · tomo slice 37/73.0]
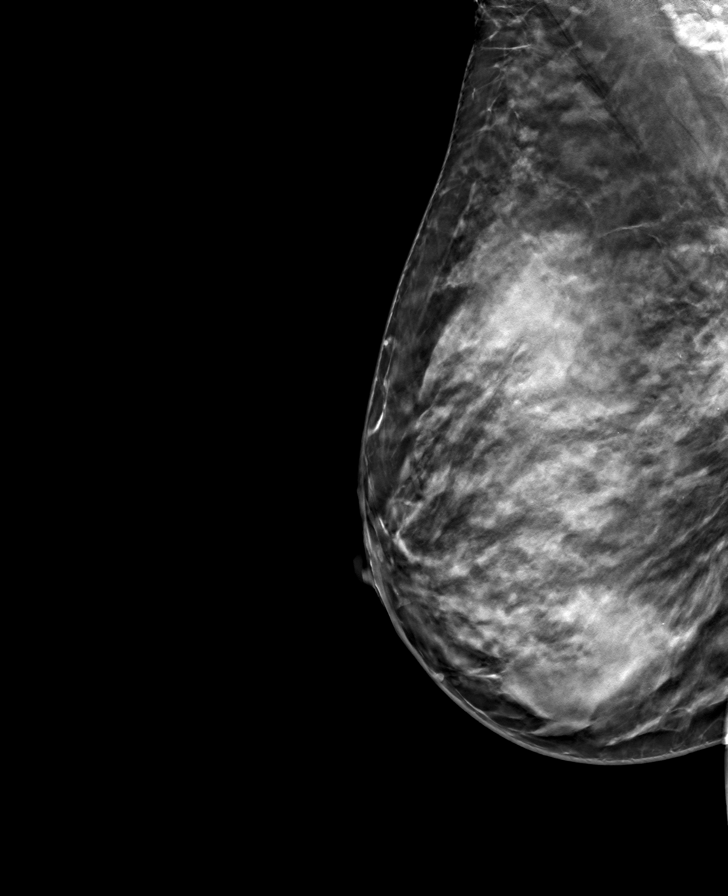

[L CC tomo · tomo slice 41/80.0]
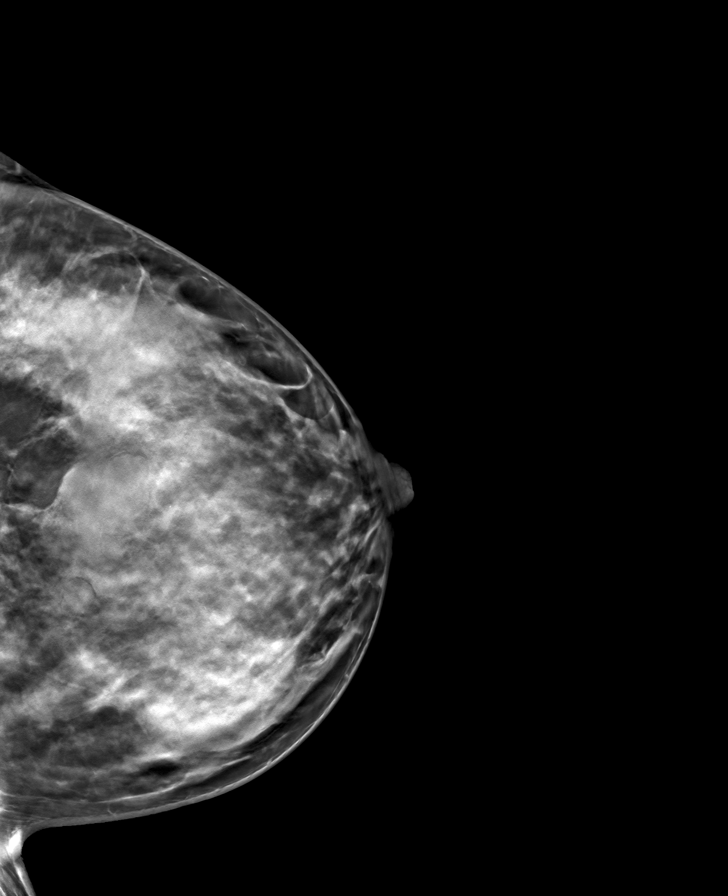

[8 of 24 positions shown; findings below may reference images not displayed]

ACR Breast Density Category d: The breast tissue is extremely dense,
which lowers the sensitivity of mammography
FINDINGS: There are no findings suspicious for malignancy. The images were
evaluated with computer-aided detection.
IMPRESSION: No mammographic evidence of malignancy. A result letter of this
screening mammogram will be mailed directly to the patient.

RECOMMENDATION:
Screening mammogram in one year. (Code:95-0-E9V)

BI-RADS CATEGORY  1: Negative.

## 2023-09-22 ENCOUNTER — Ambulatory Visit: Payer: 59 | Attending: Nurse Practitioner | Admitting: Physical Therapy

## 2023-09-22 ENCOUNTER — Other Ambulatory Visit: Payer: Self-pay

## 2023-09-22 ENCOUNTER — Encounter: Payer: Self-pay | Admitting: Physical Therapy

## 2023-09-22 DIAGNOSIS — M6281 Muscle weakness (generalized): Secondary | ICD-10-CM | POA: Diagnosis present

## 2023-09-22 DIAGNOSIS — R279 Unspecified lack of coordination: Secondary | ICD-10-CM | POA: Insufficient documentation

## 2023-09-22 NOTE — Therapy (Signed)
OUTPATIENT PHYSICAL THERAPY FEMALE PELVIC EVALUATION   Patient Name: Michele Schmidt MRN: [id number] DOB:[birth year], 58 y.o., female Today's Date: 09/22/2023  END OF SESSION:  PT End of Session - 09/22/23 0923     Visit Number 1    Date for PT Re-Evaluation 01/20/24    Authorization Type [employer]    PT Start Time 0847    PT Stop Time 0927    PT Time Calculation (min) 40 min    Activity Tolerance Patient tolerated treatment well    Behavior During Therapy [facility] for tasks assessed/performed             Past Medical History:  Diagnosis Date   Asthma    Chronic back pain    due to falls   Heart murmur    Osteoarthritis    Seasonal allergies    Past Surgical History:  Procedure Laterality Date   INTRAUTERINE DEVICE INSERTION  05/2017   Mirena   MOUTH SURGERY     REFRACTIVE SURGERY     TOE SURGERY     Patient Active Problem List   Diagnosis Date Noted   Murmur 04/29/2012   Carotid bruit 04/29/2012   Abnormal ECG 04/29/2012   Asthma    Seasonal allergies    Osteoarthritis    Chronic back pain     PCP: not per chart  REFERRING PROVIDER: Diamantina Providence, FNP  REFERRING DIAG: M62.9 (ICD-10-CM) - Disorder of muscle, unspecified  THERAPY DIAG:  Muscle weakness (generalized)  Unspecified lack of coordination  Rationale for Evaluation and Treatment: Rehabilitation  ONSET DATE: several years.   SUBJECTIVE:                                                                                                                                                                                           SUBJECTIVE STATEMENT: Drinking a lot of water (over 64oz daily). Water specifically increased urine frequency, no coffee/tea and rarely soda. With water does drink 16 oz    PAIN:  Are you having pain? No  PRECAUTIONS: None  RED FLAGS: None   WEIGHT BEARING RESTRICTIONS: No  FALLS:  Has patient fallen in last 6 months? No  LIVING ENVIRONMENT: Lives with:  lives with their family Lives in: House/apartment   OCCUPATION: social worker superficial   PLOF: Independent  PATIENT GOALS: to have less frequent urination   PERTINENT HISTORY:  Osteoarthritis, x3 vaginal births Sexual abuse: No  BOWEL MOVEMENT: Pain with bowel movement: No Type of bowel movement:Type (Bristol Stool Scale) 4, Frequency 3-4 times daily, and Strain No Fully empty rectum: Yes: sometimes Leakage: No Pads: No Fiber supplement: No  URINATION: Pain with urination: No Fully empty bladder: Yes:   Stream: Strong Urgency: Yes:   Frequency: sometimes 2-3x hours Leakage:  none Pads: No  INTERCOURSE: Pain with intercourse:  no pain Ability to have vaginal penetration:  Yes:   Climax: no painful Marinoff Scale: 0/3  PREGNANCY: Vaginal deliveries 3 Tearing Yes: with first C-section deliveries 0 Currently pregnant No  PROLAPSE: None   OBJECTIVE:  Note: Objective measures were completed at Evaluation unless otherwise noted.  DIAGNOSTIC FINDINGS:    COGNITION: Overall cognitive status: Within functional limits for tasks assessed     SENSATION: Light touch: Appears intact Proprioception: Appears intact  MUSCLE LENGTH: Bil hamstrings and adductors limited by 25%   POSTURE: No Significant postural limitations  PELVIC ALIGNMENT: WFL  LUMBARAROM/PROM:  A/PROM A/PROM  eval  Flexion WFL  Extension WFL  Right lateral flexion Limited by 25%  Left lateral flexion Limited by 25%  Right rotation Limited by 25%  Left rotation Limited by 25%   (Blank rows = not tested)  LOWER EXTREMITY ROM:  WFL  LOWER EXTREMITY MMT:  Bil hips grossly 4/5, knees 5/5  PALPATION:   General  no TTP throughout abdomen, mild fascial restrictions in lower abdominal quadrants                 External Perineal Exam [facility]                             Internal Pelvic Floor no TTP, WFL  Patient confirms identification and approves PT to assess internal pelvic floor  and treatment Yes No emotional/communication barriers or cognitive limitation. Patient is motivated to learn. Patient understands and agrees with treatment goals and plan. PT explains patient will be examined in standing, sitting, and lying down to see how their muscles and joints work. When they are ready, they will be asked to remove their underwear so PT can examine their perineum. The patient is also given the option of providing their own chaperone as one is not provided in our facility. The patient also has the right and is explained the right to defer or refuse any part of the evaluation or treatment including the internal exam. With the patient's consent, PT will use one gloved finger to gently assess the muscles of the pelvic floor, seeing how well it contracts and relaxes and if there is muscle symmetry. After, the patient will get dressed and PT and patient will discuss exam findings and plan of care. PT and patient discuss plan of care, schedule, attendance policy and HEP activities.  PELVIC MMT:   MMT eval  Vaginal 2/5 initially but improved to 4/5 with cues and reps; 10s; 4 reps  Internal Anal Sphincter   External Anal Sphincter   Puborectalis   Diastasis Recti   (Blank rows = not tested)        TONE: WFL  PROLAPSE: Not seen in hooklying with cough   TODAY'S TREATMENT:  DATE:   09/22/23 EVAL Examination completed, findings reviewed, pt educated on POC, HEP, and urge drill. Pt motivated to participate in PT and agreeable to attempt recommendations.     PATIENT EDUCATION:  Education details: MLJCAYMG Person educated: Patient Education method: Solicitor, Actor cues, Verbal cues, and Handouts Education comprehension: verbalized understanding and returned demonstration  HOME EXERCISE PROGRAM: MLJCAYMG  ASSESSMENT:  CLINICAL  IMPRESSION: Patient is a 58 y.o. female  who was seen today for physical therapy evaluation and treatment for increased urinary frequency. Pt reports she has a long history of increased urgency of urine but does endorse increased fluid intake of water quickly during the day. Pt found to have mild decreased flexibility in spine and hips, fascial restrictions in lower abdominal quadrants, decreased strength minimally in core and hip. Patient consented to internal pelvic floor assessment vaginally this date and found to have decreased strength, endurance, and coordination. Patient benefited from verbal cues for improved technique with pelvic floor contractions and coordination with breathing. Pt would benefit from additional PT to further address deficits.    OBJECTIVE IMPAIRMENTS: decreased activity tolerance, decreased coordination, decreased endurance, increased fascial restrictions, impaired flexibility, and improper body mechanics.   ACTIVITY LIMITATIONS: continence  PARTICIPATION LIMITATIONS: community activity  PERSONAL FACTORS: Time since onset of injury/illness/exacerbation are also affecting patient's functional outcome.   REHAB POTENTIAL: Good  CLINICAL DECISION MAKING: Stable/uncomplicated  EVALUATION COMPLEXITY: Low   GOALS: Goals reviewed with patient? Yes  SHORT TERM GOALS: Target date: 10/20/23  Pt to be I with HEP.  Baseline: Goal status: INITIAL  2.  Pt to be I urge drill for decreased urgency and frequency.  Baseline:  Goal status: INITIAL  LONG TERM GOALS: Target date: 01/20/24  Pt to be I with advanced HEP.  Baseline:  Goal status: INITIAL  2.  Pt will have 50% less urgency due to bladder retraining and strengthening for improved QOL Baseline:  Goal status: INITIAL  3.  Pt to report improved time between bladder voids to at least 2 hours for improved QOL with decreased urinary frequency.   Baseline:  Goal status: INITIAL  4.  Pt to demonstrate at least 4/5  pelvic floor strength consistently and with at least 8s holds for improved pelvic stability and decreased strain at pelvic floor/ decrease leakage.  Baseline:  Goal status: INITIAL  5.  Pt to demonstrate improved coordination of pelvic floor and breathing mechanics with 10# squat with appropriate synergistic patterns to decrease urgency at least 75% of the time.    Baseline:  Goal status: INITIAL    PLAN:  PT FREQUENCY: 1x/week  PT DURATION:  6 sessions  PLANNED INTERVENTIONS: 97110-Therapeutic exercises, 97530- Therapeutic activity, 97112- Neuromuscular re-education, 97535- Self Care, 40981- Manual therapy, Patient/Family education, Taping, Dry Needling, Joint mobilization, Spinal mobilization, Scar mobilization, Cryotherapy, Moist heat, and Biofeedback  PLAN FOR NEXT SESSION: review urge drill in various positions, coordination of pelvic floor with activity, breathing mechanics, and core/hip strength with pelvic floor activation, manual internal as needed     Otelia Sergeant, PT, DPT 09/21/2510:27 PM  [facility] [address], [address] [place], [place] [address] Phone # [phone] Fax [phone]

## 2023-09-22 NOTE — Patient Instructions (Signed)
Urge Incontinence  Ideal urination frequency is every 2-4 wakeful hours, which equates to 5-8 times within a 24-hour period.   Urge incontinence is leakage that occurs when the bladder muscle contracts, creating a sudden need to go before getting to the bathroom.   Going too often when your bladder isn't actually full can disrupt the body's automatic signals to store and hold urine longer, which will increase urgency/frequency.  In this case, the bladder "is running the show" and strategies can be learned to retrain this pattern.   One should be able to control the first urge to urinate, at around 150mL.  The bladder can hold up to a "grande latte," or 400mL. To help you gain control, practice the Urge Drill below when urgency strikes.  This drill will help retrain your bladder signals and allow you to store and hold urine longer.  The overall goal is to stretch out your time between voids to reach a more manageable voiding schedule.    Practice your "quick flicks" often throughout the day (each waking hour) even when you don't need feel the urge to go.  This will help strengthen your pelvic floor muscles, making them more effective in controlling leakage.  Urge Drill  When you feel an urge to go, follow these steps to regain control: Stop what you are doing and be still Take one deep breath, directing your air into your abdomen Think an affirming thought, such as "I've got this." Do 5 quick flicks of your pelvic floor Walk with control to the bathroom to void, or delay voiding   Bladder Irritants  Certain foods and beverages can be irritating to the bladder.  Avoiding these irritants may decrease your symptoms of urinary urgency, frequency or bladder pain.  Even reducing your intake can help with your symptoms.  Not everyone is sensitive to all bladder irritants, so you may consider focusing on one irritant at a time, removing or reducing your intake of that irritant for 7-10 days to see if  this change helps your symptoms.  Water intake is also very important.  Below is a list of bladder irritants.  Drinks: alcohol, carbonated beverages, caffeinated beverages such as coffee and tea, drinks with artificial sweeteners, citrus juices, apple juice, tomato juice  Foods: tomatoes and tomato based foods, spicy food, sugar and artificial sweeteners, vinegar, chocolate, raw onion, apples, citrus fruits, pineapple, cranberries, tomatoes, strawberries, plums, peaches, cantaloupe  Other: acidic urine (too concentrated) - see water intake info below  Substitutes you can try that are NOT irritating to the bladder: cooked onion, pears, papayas, sun-brewed decaf teas, watermelons, non-citrus herbal teas, apricots, kava and low-acid instant drinks (Postum).    WATER INTAKE: Remember to drink lots of water (aim for fluid intake of half your body weight with 2/3 of fluids being water).  You may be limiting fluids due to fear of leakage, but this can actually worsen urgency symptoms due to highly concentrated urine.  Water helps balance the pH of your urine so it doesn't become too acidic - acidic urine is a bladder irritant!    

## 2023-09-29 ENCOUNTER — Encounter: Payer: 59 | Admitting: Physical Therapy

## 2023-10-06 ENCOUNTER — Ambulatory Visit: Payer: 59 | Admitting: Physical Therapy

## 2023-10-06 ENCOUNTER — Telehealth: Payer: Self-pay | Admitting: Physical Therapy

## 2023-10-06 NOTE — Telephone Encounter (Signed)
PT called pt about this morning's appointment at 0845. Pt had thought she declined appointment and apologetic. Was given next appt time and date.   This is pt's first no show for appt.   Otelia Sergeant, PT, DPT 02/04/259:18 AM

## 2023-10-13 ENCOUNTER — Ambulatory Visit: Payer: 59 | Attending: Nurse Practitioner | Admitting: Physical Therapy

## 2023-10-13 DIAGNOSIS — R279 Unspecified lack of coordination: Secondary | ICD-10-CM | POA: Diagnosis present

## 2023-10-13 DIAGNOSIS — M6281 Muscle weakness (generalized): Secondary | ICD-10-CM | POA: Insufficient documentation

## 2023-10-13 NOTE — Therapy (Signed)
OUTPATIENT PHYSICAL THERAPY FEMALE PELVIC TREATMENT   Patient Name: Michele Schmidt MRN: [id number] DOB:[birth year], 58 y.o., female Today's Date: 10/13/2023  END OF SESSION:  PT End of Session - 10/13/23 0854     Visit Number 2    Date for PT Re-Evaluation 01/20/24    Authorization Type [employer]    PT Start Time 0847    PT Stop Time 0925    PT Time Calculation (min) 38 min    Activity Tolerance Patient tolerated treatment well    Behavior During Therapy [facility] for tasks assessed/performed             Past Medical History:  Diagnosis Date   Asthma    Chronic back pain    due to falls   Heart murmur    Osteoarthritis    Seasonal allergies    Past Surgical History:  Procedure Laterality Date   INTRAUTERINE DEVICE INSERTION  05/2017   Mirena   MOUTH SURGERY     REFRACTIVE SURGERY     TOE SURGERY     Patient Active Problem List   Diagnosis Date Noted   Murmur 04/29/2012   Carotid bruit 04/29/2012   Abnormal ECG 04/29/2012   Asthma    Seasonal allergies    Osteoarthritis    Chronic back pain     PCP: not per chart  REFERRING PROVIDER: Diamantina Providence, FNP  REFERRING DIAG: M62.9 (ICD-10-CM) - Disorder of muscle, unspecified  THERAPY DIAG:  Muscle weakness (generalized)  Unspecified lack of coordination  Rationale for Evaluation and Treatment: Rehabilitation  ONSET DATE: several years.   SUBJECTIVE:                                                                                                                                                                                           SUBJECTIVE STATEMENT: Now able to go to the bathroom every 3 hours without leakage, does report a little increased urge with standing but not    PAIN:  Are you having pain? No  PRECAUTIONS: None  RED FLAGS: None   WEIGHT BEARING RESTRICTIONS: No  FALLS:  Has patient fallen in last 6 months? No  LIVING ENVIRONMENT: Lives with: lives with their family Lives  in: House/apartment   OCCUPATION: social worker superficial   PLOF: Independent  PATIENT GOALS: to have less frequent urination   PERTINENT HISTORY:  Osteoarthritis, x3 vaginal births Sexual abuse: No  BOWEL MOVEMENT: Pain with bowel movement: No Type of bowel movement:Type (Bristol Stool Scale) 4, Frequency 3-4 times daily, and Strain No Fully empty rectum: Yes: sometimes Leakage: No Pads: No Fiber supplement: No  URINATION: Pain  with urination: No Fully empty bladder: Yes:   Stream: Strong Urgency: Yes:   Frequency: sometimes 2-3x hours Leakage:  none Pads: No  INTERCOURSE: Pain with intercourse:  no pain Ability to have vaginal penetration:  Yes:   Climax: no painful Marinoff Scale: 0/3  PREGNANCY: Vaginal deliveries 3 Tearing Yes: with first C-section deliveries 0 Currently pregnant No  PROLAPSE: None   OBJECTIVE:  Note: Objective measures were completed at Evaluation unless otherwise noted.  DIAGNOSTIC FINDINGS:    COGNITION: Overall cognitive status: Within functional limits for tasks assessed     SENSATION: Light touch: Appears intact Proprioception: Appears intact  MUSCLE LENGTH: Bil hamstrings and adductors limited by 25%   POSTURE: No Significant postural limitations  PELVIC ALIGNMENT: WFL  LUMBARAROM/PROM:  A/PROM A/PROM  eval  Flexion WFL  Extension WFL  Right lateral flexion Limited by 25%  Left lateral flexion Limited by 25%  Right rotation Limited by 25%  Left rotation Limited by 25%   (Blank rows = not tested)  LOWER EXTREMITY ROM:  WFL  LOWER EXTREMITY MMT:  Bil hips grossly 4/5, knees 5/5  PALPATION:   General  no TTP throughout abdomen, mild fascial restrictions in lower abdominal quadrants                 External Perineal Exam [facility]                             Internal Pelvic Floor no TTP, WFL  Patient confirms identification and approves PT to assess internal pelvic floor and treatment Yes No  emotional/communication barriers or cognitive limitation. Patient is motivated to learn. Patient understands and agrees with treatment goals and plan. PT explains patient will be examined in standing, sitting, and lying down to see how their muscles and joints work. When they are ready, they will be asked to remove their underwear so PT can examine their perineum. The patient is also given the option of providing their own chaperone as one is not provided in our facility. The patient also has the right and is explained the right to defer or refuse any part of the evaluation or treatment including the internal exam. With the patient's consent, PT will use one gloved finger to gently assess the muscles of the pelvic floor, seeing how well it contracts and relaxes and if there is muscle symmetry. After, the patient will get dressed and PT and patient will discuss exam findings and plan of care. PT and patient discuss plan of care, schedule, attendance policy and HEP activities.  PELVIC MMT:   MMT eval  Vaginal 2/5 initially but improved to 4/5 with cues and reps; 10s; 4 reps  Internal Anal Sphincter   External Anal Sphincter   Puborectalis   Diastasis Recti   (Blank rows = not tested)        TONE: WFL  PROLAPSE: Not seen in hooklying with cough   TODAY'S TREATMENT:  DATE:   09/22/23 EVAL Examination completed, findings reviewed, pt educated on POC, HEP, and urge drill. Pt motivated to participate in PT and agreeable to attempt recommendations.    10/13/23: Educated on continued HEP, progression of continued activity level and community resources to support this Variation in urge drill for needs currently and lowering for every single urge to urinate to self monitor progress.   PATIENT EDUCATION:  Education details: MLJCAYMG Person educated: Patient Education method:  Solicitor, Actor cues, Verbal cues, and Handouts Education comprehension: verbalized understanding and returned demonstration  HOME EXERCISE PROGRAM: MLJCAYMG  ASSESSMENT:  CLINICAL IMPRESSION: Patient presents for treatment today and reports she has no increased frequency now. She is doing much better, having normal frequency and declines additional needs for PT. Pt has met all STG, declined internal reassessment as she is no longer having symptoms and other than this met all LTG. Pt understands she will need new referral for future PT needs.   OBJECTIVE IMPAIRMENTS: decreased activity tolerance, decreased coordination, decreased endurance, increased fascial restrictions, impaired flexibility, and improper body mechanics.   ACTIVITY LIMITATIONS: continence  PARTICIPATION LIMITATIONS: community activity  PERSONAL FACTORS: Time since onset of injury/illness/exacerbation are also affecting patient's functional outcome.   REHAB POTENTIAL: Good  CLINICAL DECISION MAKING: Stable/uncomplicated  EVALUATION COMPLEXITY: Low   GOALS: Goals reviewed with patient? Yes  SHORT TERM GOALS: Target date: 10/20/23  Pt to be I with HEP.  Baseline: Goal status: MET  2.  Pt to be I urge drill for decreased urgency and frequency.  Baseline:  Goal status: MET  LONG TERM GOALS: Target date: 01/20/24  Pt to be I with advanced HEP.  Baseline:  Goal status: MET  2.  Pt will have 50% less urgency due to bladder retraining and strengthening for improved QOL Baseline:  Goal status: MET  3.  Pt to report improved time between bladder voids to at least 2 hours for improved QOL with decreased urinary frequency.   Baseline:  Goal status: MET  4.  Pt to demonstrate at least 4/5 pelvic floor strength consistently and with at least 8s holds for improved pelvic stability and decreased strain at pelvic floor/ decrease leakage.  Baseline:  Goal status: declined   5.  Pt to  demonstrate improved coordination of pelvic floor and breathing mechanics with 10# squat with appropriate synergistic patterns to decrease urgency at least 75% of the time.    Baseline:  Goal status: INITIAL    PLAN:  PT FREQUENCY: 1x/week  PT DURATION:  6 sessions  PLANNED INTERVENTIONS: 97110-Therapeutic exercises, 97530- Therapeutic activity, 97112- Neuromuscular re-education, 97535- Self Care, 19147- Manual therapy, Patient/Family education, Taping, Dry Needling, Joint mobilization, Spinal mobilization, Scar mobilization, Cryotherapy, Moist heat, and Biofeedback  PLAN FOR NEXT SESSION:   PHYSICAL THERAPY DISCHARGE SUMMARY  Visits from Start of Care: 2  Current functional level related to goals / functional outcomes: All goals met, except internal reassessment of pelvic floor strength as pt declined this    Remaining deficits: None per pt    Education / Equipment: HEP   Patient agrees to discharge. Patient goals were met. Patient is being discharged due to being pleased with the current functional level.    Otelia Sergeant, PT, DPT 02/11/259:32 AM  [facility] [address] [place], [place] [address] Phone # [phone] Fax [phone]

## 2023-12-04 ENCOUNTER — Other Ambulatory Visit: Payer: Self-pay | Admitting: Obstetrics and Gynecology

## 2023-12-10 LAB — SURGICAL PATHOLOGY
